# Patient Record
Sex: Female | Born: 1945
Health system: Southern US, Community
[De-identification: ages and names within clinical notes are randomized; demographics above are authoritative.]

## PROBLEM LIST (undated history)

## (undated) DIAGNOSIS — M810 Age-related osteoporosis without current pathological fracture: Secondary | ICD-10-CM

## (undated) DIAGNOSIS — R51 Headache: Secondary | ICD-10-CM

## (undated) DIAGNOSIS — R7303 Prediabetes: Secondary | ICD-10-CM

## (undated) DIAGNOSIS — I639 Cerebral infarction, unspecified: Secondary | ICD-10-CM

## (undated) DIAGNOSIS — E7800 Pure hypercholesterolemia, unspecified: Secondary | ICD-10-CM

## (undated) DIAGNOSIS — C801 Malignant (primary) neoplasm, unspecified: Secondary | ICD-10-CM

## (undated) DIAGNOSIS — E559 Vitamin D deficiency, unspecified: Secondary | ICD-10-CM

## (undated) DIAGNOSIS — M1990 Unspecified osteoarthritis, unspecified site: Secondary | ICD-10-CM

## (undated) DIAGNOSIS — K219 Gastro-esophageal reflux disease without esophagitis: Secondary | ICD-10-CM

## (undated) DIAGNOSIS — I10 Essential (primary) hypertension: Secondary | ICD-10-CM

## (undated) DIAGNOSIS — R519 Headache, unspecified: Secondary | ICD-10-CM

## (undated) DIAGNOSIS — H5500 Unspecified nystagmus: Secondary | ICD-10-CM

## (undated) DIAGNOSIS — G43909 Migraine, unspecified, not intractable, without status migrainosus: Secondary | ICD-10-CM

## (undated) HISTORY — PX: CATARACT EXTRACTION: SUR2

## (undated) HISTORY — PX: BREAST EXCISIONAL BIOPSY: SUR124

## (undated) HISTORY — PX: TUBAL LIGATION: SHX77

## (undated) HISTORY — PX: ABDOMINAL HYSTERECTOMY: SHX81

## (undated) HISTORY — PX: BREAST ENHANCEMENT SURGERY: SHX7

## (undated) HISTORY — PX: HIP SURGERY: SHX245

## (undated) HISTORY — PX: ENDOSCOPIC RELEASE TRANSVERSE CARPAL LIGAMENT OF HAND: SUR444

## (undated) HISTORY — PX: TONSILLECTOMY: SUR1361

## (undated) HISTORY — PX: EYE SURGERY: SHX253

---

## 1992-05-01 DIAGNOSIS — I639 Cerebral infarction, unspecified: Secondary | ICD-10-CM

## 1992-05-01 HISTORY — PX: OTHER SURGICAL HISTORY: SHX169

## 1992-05-01 HISTORY — DX: Cerebral infarction, unspecified: I63.9

## 2011-02-02 ENCOUNTER — Ambulatory Visit: Payer: Self-pay

## 2011-02-02 DIAGNOSIS — Z01810 Encounter for preprocedural cardiovascular examination: Secondary | ICD-10-CM

## 2012-01-03 ENCOUNTER — Ambulatory Visit: Payer: Self-pay | Admitting: Internal Medicine

## 2012-05-01 HISTORY — PX: BREAST IMPLANT REMOVAL: SUR1101

## 2013-01-21 ENCOUNTER — Ambulatory Visit: Payer: Self-pay | Admitting: Internal Medicine

## 2013-02-18 ENCOUNTER — Ambulatory Visit: Payer: Self-pay | Admitting: Ophthalmology

## 2013-03-25 ENCOUNTER — Ambulatory Visit: Payer: Self-pay | Admitting: Ophthalmology

## 2014-02-18 ENCOUNTER — Ambulatory Visit: Payer: Self-pay | Admitting: Internal Medicine

## 2014-09-04 DIAGNOSIS — R030 Elevated blood-pressure reading, without diagnosis of hypertension: Secondary | ICD-10-CM | POA: Diagnosis not present

## 2014-09-04 DIAGNOSIS — E789 Disorder of lipoprotein metabolism, unspecified: Secondary | ICD-10-CM | POA: Diagnosis not present

## 2014-09-09 DIAGNOSIS — Z23 Encounter for immunization: Secondary | ICD-10-CM | POA: Diagnosis not present

## 2014-09-09 DIAGNOSIS — K219 Gastro-esophageal reflux disease without esophagitis: Secondary | ICD-10-CM | POA: Diagnosis not present

## 2014-09-09 DIAGNOSIS — G43809 Other migraine, not intractable, without status migrainosus: Secondary | ICD-10-CM | POA: Diagnosis not present

## 2014-09-09 DIAGNOSIS — I10 Essential (primary) hypertension: Secondary | ICD-10-CM | POA: Diagnosis not present

## 2015-01-01 DIAGNOSIS — R300 Dysuria: Secondary | ICD-10-CM | POA: Diagnosis not present

## 2015-03-16 DIAGNOSIS — M7711 Lateral epicondylitis, right elbow: Secondary | ICD-10-CM | POA: Diagnosis not present

## 2015-03-16 DIAGNOSIS — G43809 Other migraine, not intractable, without status migrainosus: Secondary | ICD-10-CM | POA: Diagnosis not present

## 2015-03-16 DIAGNOSIS — I10 Essential (primary) hypertension: Secondary | ICD-10-CM | POA: Diagnosis not present

## 2015-03-16 DIAGNOSIS — M6530 Trigger finger, unspecified finger: Secondary | ICD-10-CM | POA: Diagnosis not present

## 2015-03-16 DIAGNOSIS — H159 Unspecified disorder of sclera: Secondary | ICD-10-CM | POA: Diagnosis not present

## 2015-03-16 DIAGNOSIS — Z0000 Encounter for general adult medical examination without abnormal findings: Secondary | ICD-10-CM | POA: Diagnosis not present

## 2015-03-22 DIAGNOSIS — M720 Palmar fascial fibromatosis [Dupuytren]: Secondary | ICD-10-CM | POA: Diagnosis not present

## 2015-04-08 ENCOUNTER — Other Ambulatory Visit: Payer: Self-pay

## 2015-04-08 ENCOUNTER — Encounter: Payer: Self-pay | Admitting: *Deleted

## 2015-04-08 NOTE — Patient Instructions (Signed)
  Your procedure is scheduled on: 04-16-15 (FRIDAY) Report to [facility] To find out your arrival time please call [phone] between 1PM - 3PM on 04-15-15 (THURSDAY)  Remember: Instructions that are not followed completely may result in serious medical risk, up to and including death, or upon the discretion of your surgeon and anesthesiologist your surgery may need to be rescheduled.    _X___ 1. Do not eat food or drink liquids after midnight. No gum chewing or hard candies.     _X___ 2. No Alcohol for 24 hours before or after surgery.   ____ 3. Bring all medications with you on the day of surgery if instructed.    _X___ 4. Notify your doctor if there is any change in your medical condition     (cold, fever, infections).     Do not wear jewelry, make-up, hairpins, clips or nail polish.  Do not wear lotions, powders, or perfumes. You may wear deodorant.  Do not shave 48 hours prior to surgery. Men may shave face and neck.  Do not bring valuables to the hospital.    [facility] is not responsible for any belongings or valuables.               Contacts, dentures or bridgework may not be worn into surgery.  Leave your suitcase in the car. After surgery it may be brought to your room.  For patients admitted to the hospital, discharge time is determined by your treatment team.   Patients discharged the day of surgery will not be allowed to drive home.   Please read over the following fact sheets that you were given:      ____ Take these medicines the morning of surgery with A SIP OF WATER:    1. NONE  2.   3.   4.  5.  6.  ____ Fleet Enema (as directed)   _X___ Use CHG Soap as directed  ____ Use inhalers on the day of surgery  ____ Stop metformin 2 days prior to surgery    ____ Take 1/2 of usual insulin dose the night before surgery and none on the morning of surgery.   ____ Stop Coumadin/Plavix/aspirin-STOP ASPIRIN NOW  ____ Stop  Anti-inflammatories-STOP IBUPROFEN/ALEVE NOW-NO NSAIDS OR ASPIRIN PRODUCTS-TYLENOL OK   ____ Stop supplements until after surgery.    ____ Bring C-Pap to the hospital.

## 2015-04-09 ENCOUNTER — Encounter
Admission: RE | Admit: 2015-04-09 | Discharge: 2015-04-09 | Disposition: A | Discharge status: Home / Self Care | Payer: Commercial Managed Care - HMO | Source: Ambulatory Visit | Attending: Anesthesiology | Admitting: Anesthesiology

## 2015-04-09 DIAGNOSIS — Z01810 Encounter for preprocedural cardiovascular examination: Secondary | ICD-10-CM | POA: Diagnosis not present

## 2015-04-09 DIAGNOSIS — Z01812 Encounter for preprocedural laboratory examination: Secondary | ICD-10-CM | POA: Insufficient documentation

## 2015-04-09 LAB — CBC
HEMATOCRIT: 38.5 % (ref 35.0–47.0)
HEMOGLOBIN: 12.8 g/dL (ref 12.0–16.0)
MCH: 30.9 pg (ref 26.0–34.0)
MCHC: 33.2 g/dL (ref 32.0–36.0)
MCV: 93.1 fL (ref 80.0–100.0)
PLATELETS: 349 10*3/uL (ref 150–440)
RBC: 4.14 MIL/uL (ref 3.80–5.20)
RDW: 13.7 % (ref 11.5–14.5)
WBC: 9.8 10*3/uL (ref 3.6–11.0)

## 2015-04-12 NOTE — Pre-Procedure Instructions (Signed)
CALLED DR SMITHS OFFICE REGARDING 44 MG ASA-PT WITH A H/O STROKE IN THE 1990'S-DR SMITH SAID FOR PT TO STOP HER ASA-I CALLED PT TO LET HER KNOW THIS AND SHE SAID THAT SHE HAD ALREADY STOPPED IT FOR HER SURGERY OVER THE WEEKEND

## 2015-04-16 ENCOUNTER — Ambulatory Visit
Admission: RE | Admit: 2015-04-16 | Discharge: 2015-04-16 | Disposition: A | Discharge status: Home / Self Care | Payer: Commercial Managed Care - HMO | Source: Ambulatory Visit | Attending: Specialist | Admitting: Specialist

## 2015-04-16 ENCOUNTER — Ambulatory Visit: Payer: Commercial Managed Care - HMO | Admitting: Certified Registered Nurse Anesthetist

## 2015-04-16 ENCOUNTER — Encounter
Admission: RE | Disposition: A | Discharge status: Home / Self Care | Payer: Self-pay | Source: Ambulatory Visit | Attending: Specialist

## 2015-04-16 ENCOUNTER — Encounter: Payer: Self-pay | Admitting: *Deleted

## 2015-04-16 DIAGNOSIS — H532 Diplopia: Secondary | ICD-10-CM | POA: Diagnosis not present

## 2015-04-16 DIAGNOSIS — Z7982 Long term (current) use of aspirin: Secondary | ICD-10-CM | POA: Insufficient documentation

## 2015-04-16 DIAGNOSIS — H409 Unspecified glaucoma: Secondary | ICD-10-CM | POA: Diagnosis not present

## 2015-04-16 DIAGNOSIS — Z8673 Personal history of transient ischemic attack (TIA), and cerebral infarction without residual deficits: Secondary | ICD-10-CM | POA: Diagnosis not present

## 2015-04-16 DIAGNOSIS — R51 Headache: Secondary | ICD-10-CM | POA: Insufficient documentation

## 2015-04-16 DIAGNOSIS — Z8261 Family history of arthritis: Secondary | ICD-10-CM | POA: Diagnosis not present

## 2015-04-16 DIAGNOSIS — K219 Gastro-esophageal reflux disease without esophagitis: Secondary | ICD-10-CM | POA: Insufficient documentation

## 2015-04-16 DIAGNOSIS — Z79899 Other long term (current) drug therapy: Secondary | ICD-10-CM | POA: Diagnosis not present

## 2015-04-16 DIAGNOSIS — I10 Essential (primary) hypertension: Secondary | ICD-10-CM | POA: Diagnosis not present

## 2015-04-16 DIAGNOSIS — Z8249 Family history of ischemic heart disease and other diseases of the circulatory system: Secondary | ICD-10-CM | POA: Insufficient documentation

## 2015-04-16 DIAGNOSIS — M720 Palmar fascial fibromatosis [Dupuytren]: Secondary | ICD-10-CM | POA: Diagnosis not present

## 2015-04-16 HISTORY — PX: DUPUYTREN CONTRACTURE RELEASE: SHX1478

## 2015-04-16 HISTORY — DX: Cerebral infarction, unspecified: I63.9

## 2015-04-16 HISTORY — DX: Headache: R51

## 2015-04-16 HISTORY — DX: Malignant (primary) neoplasm, unspecified: C80.1

## 2015-04-16 HISTORY — DX: Headache, unspecified: R51.9

## 2015-04-16 HISTORY — DX: Age-related osteoporosis without current pathological fracture: M81.0

## 2015-04-16 HISTORY — DX: Essential (primary) hypertension: I10

## 2015-04-16 HISTORY — DX: Unspecified osteoarthritis, unspecified site: M19.90

## 2015-04-16 HISTORY — DX: Gastro-esophageal reflux disease without esophagitis: K21.9

## 2015-04-16 SURGERY — RELEASE, DUPUYTREN CONTRACTURE
Anesthesia: General | Laterality: Left

## 2015-04-16 MED ORDER — PROPOFOL 10 MG/ML IV BOLUS
INTRAVENOUS | Status: DC | PRN
  Administered 2015-04-16: 150 mg via INTRAVENOUS

## 2015-04-16 MED ORDER — BUPIVACAINE HCL (PF) 0.5 % IJ SOLN
INTRAMUSCULAR | Status: AC
  Filled 2015-04-16: qty 30

## 2015-04-16 MED ORDER — DEXAMETHASONE SODIUM PHOSPHATE 10 MG/ML IJ SOLN
INTRAMUSCULAR | Status: DC | PRN
  Administered 2015-04-16: 5 mg via INTRAVENOUS

## 2015-04-16 MED ORDER — MIDAZOLAM HCL 2 MG/2ML IJ SOLN
INTRAMUSCULAR | Status: DC | PRN
  Administered 2015-04-16 (×2): 1 mg via INTRAVENOUS

## 2015-04-16 MED ORDER — ONDANSETRON HCL 4 MG/2ML IJ SOLN
INTRAMUSCULAR | Status: DC | PRN
  Administered 2015-04-16: 4 mg via INTRAVENOUS

## 2015-04-16 MED ORDER — FAMOTIDINE 20 MG PO TABS
ORAL_TABLET | ORAL | Status: AC
  Administered 2015-04-16: 20 mg via ORAL
  Filled 2015-04-16: qty 1

## 2015-04-16 MED ORDER — FENTANYL CITRATE (PF) 100 MCG/2ML IJ SOLN
INTRAMUSCULAR | Status: DC | PRN
  Administered 2015-04-16 (×8): 25 ug via INTRAVENOUS

## 2015-04-16 MED ORDER — EPHEDRINE SULFATE 50 MG/ML IJ SOLN
INTRAMUSCULAR | Status: DC | PRN
  Administered 2015-04-16: 5 mg via INTRAVENOUS

## 2015-04-16 MED ORDER — LABETALOL HCL 5 MG/ML IV SOLN
INTRAVENOUS | Status: DC | PRN
  Administered 2015-04-16: 10 mg via INTRAVENOUS

## 2015-04-16 MED ORDER — ONDANSETRON HCL 4 MG/2ML IJ SOLN
4.0000 mg | Freq: Once | INTRAMUSCULAR | Status: DC | PRN
Start: 2015-04-16 — End: 2015-04-16

## 2015-04-16 MED ORDER — LACTATED RINGERS IV SOLN
INTRAVENOUS | Status: DC
  Administered 2015-04-16: 07:00:00 via INTRAVENOUS

## 2015-04-16 MED ORDER — ACETAMINOPHEN 10 MG/ML IV SOLN
INTRAVENOUS | Status: DC | PRN
  Administered 2015-04-16: 1000 mg via INTRAVENOUS

## 2015-04-16 MED ORDER — HYDROCODONE-ACETAMINOPHEN 5-325 MG PO TABS: 1.0000 | ORAL_TABLET | Freq: Four times a day (QID) | ORAL | Status: DC | PRN

## 2015-04-16 MED ORDER — CEFAZOLIN SODIUM 1-5 GM-% IV SOLN
INTRAVENOUS | Status: AC
  Filled 2015-04-16: qty 50

## 2015-04-16 MED ORDER — BUPIVACAINE HCL (PF) 0.5 % IJ SOLN
INTRAMUSCULAR | Status: DC | PRN
  Administered 2015-04-16: 10 mL

## 2015-04-16 MED ORDER — FAMOTIDINE 20 MG PO TABS
20.0000 mg | ORAL_TABLET | Freq: Once | ORAL | Status: AC
  Administered 2015-04-16: 20 mg via ORAL

## 2015-04-16 MED ORDER — GLYCOPYRROLATE 0.2 MG/ML IJ SOLN
INTRAMUSCULAR | Status: DC | PRN
  Administered 2015-04-16: 0.2 mg via INTRAVENOUS

## 2015-04-16 MED ORDER — CEFAZOLIN SODIUM 1-5 GM-% IV SOLN
1.0000 g | Freq: Once | INTRAVENOUS | Status: AC
  Administered 2015-04-16: 1 g via INTRAVENOUS

## 2015-04-16 MED ORDER — FENTANYL CITRATE (PF) 100 MCG/2ML IJ SOLN: 25.0000 ug | INTRAMUSCULAR | Status: DC | PRN

## 2015-04-16 MED ORDER — LIDOCAINE HCL (CARDIAC) 20 MG/ML IV SOLN
INTRAVENOUS | Status: DC | PRN
  Administered 2015-04-16: 60 mg via INTRAVENOUS

## 2015-04-16 MED ORDER — ACETAMINOPHEN 10 MG/ML IV SOLN
INTRAVENOUS | Status: AC
Start: 2015-04-16 — End: 2015-04-16
  Filled 2015-04-16: qty 100

## 2015-04-16 SURGICAL SUPPLY — 25 items
BANDAGE ELASTIC 3 LF NS (GAUZE/BANDAGES/DRESSINGS) ×3 IMPLANT
BLADE SURG 15 STRL LF DISP TIS (BLADE) ×2 IMPLANT
BLADE SURG 15 STRL SS (BLADE) ×4
BNDG ELASTIC 2X5.8 VLCR STR LF (GAUZE/BANDAGES/DRESSINGS) ×3 IMPLANT
BNDG ESMARK 4X12 TAN STRL LF (GAUZE/BANDAGES/DRESSINGS) ×3 IMPLANT
CAST PADDING 2X4YD ST 30245 (MISCELLANEOUS) ×2
CHLORAPREP W/TINT 26ML (MISCELLANEOUS) ×3 IMPLANT
GAUZE PETRO XEROFOAM 1X8 (MISCELLANEOUS) ×3 IMPLANT
GAUZE SPONGE 4X4 12PLY STRL (GAUZE/BANDAGES/DRESSINGS) ×3 IMPLANT
GLOVE BIO SURGEON STRL SZ7.5 (GLOVE) ×6 IMPLANT
GOWN STRL REUS W/ TWL LRG LVL3 (GOWN DISPOSABLE) ×1 IMPLANT
GOWN STRL REUS W/TWL LRG LVL3 (GOWN DISPOSABLE) ×2
LOOP VESSEL MINI 0.8X406 BLUE (MISCELLANEOUS) ×1 IMPLANT
LOOPS BLUE MINI 0.8X406MM (MISCELLANEOUS) ×2
NS IRRIG 1000ML POUR BTL (IV SOLUTION) ×3 IMPLANT
PACK EXTREMITY ARMC (MISCELLANEOUS) ×3 IMPLANT
PADDING CAST COTTON 2X4 ST (MISCELLANEOUS) ×1 IMPLANT
SPLINT CAST 1 STEP 3X12 (MISCELLANEOUS) ×3 IMPLANT
STOCKINETTE BIAS CUT 3 980034 (MISCELLANEOUS) ×3 IMPLANT
STOCKINETTE STRL 4IN 9604848 (GAUZE/BANDAGES/DRESSINGS) ×3 IMPLANT
STRAP SAFETY BODY (MISCELLANEOUS) ×3 IMPLANT
SUT ETHILON 5 0 P 3 18 (SUTURE) ×26
SUT ETHILON 7 0 P 1 (SUTURE) ×3 IMPLANT
SUT NYLON ETHILON 5-0 P-3 1X18 (SUTURE) ×13 IMPLANT
SUT PROLENE 2 0 CT2 30 (SUTURE) ×3 IMPLANT

## 2015-04-16 NOTE — Anesthesia Procedure Notes (Signed)
Procedure Name: LMA Insertion Date/Time: 04/16/2015 7:26 AM Performed by: Johnna Acosta Pre-anesthesia Checklist: Patient identified, Emergency Drugs available, Suction available and Patient being monitored Patient Re-evaluated:Patient Re-evaluated prior to inductionOxygen Delivery Method: Circle system utilized Preoxygenation: Pre-oxygenation with 100% oxygen Intubation Type: IV induction Ventilation: Mask ventilation without difficulty LMA: LMA inserted LMA Size: 3.5 Tube type: Oral Number of attempts: 1 Placement Confirmation: ETT inserted through vocal cords under direct vision,  positive ETCO2 and breath sounds checked- equal and bilateral Tube secured with: Tape Dental Injury: Teeth and Oropharynx as per pre-operative assessment

## 2015-04-16 NOTE — Anesthesia Preprocedure Evaluation (Signed)
Anesthesia Evaluation  Patient identified by MRN, date of birth, ID band Patient awake    Reviewed: Allergy & Precautions, H&P , NPO status , Patient's Chart, lab work & pertinent test results, reviewed documented beta blocker date and time   Airway Mallampati: II  TM Distance: >3 FB Neck ROM: full    Dental no notable dental hx. (+) Teeth Intact   Pulmonary neg pulmonary ROS,    Pulmonary exam normal breath sounds clear to auscultation       Cardiovascular Exercise Tolerance: Good hypertension, negative cardio ROS Normal cardiovascular exam Rhythm:regular Rate:Normal     Neuro/Psych  Headaches, Periodic double vision but no upper or lower extr loss of function described CVA, Residual Symptoms negative neurological ROS  negative psych ROS   GI/Hepatic negative GI ROS, Neg liver ROS, GERD  ,  Endo/Other  negative endocrine ROS  Renal/GU negative Renal ROS  negative genitourinary   Musculoskeletal   Abdominal   Peds  Hematology negative hematology ROS (+)   Anesthesia Other Findings   Reproductive/Obstetrics negative OB ROS                             Anesthesia Physical Anesthesia Plan  ASA: III  Anesthesia Plan: General and General LMA   Post-op Pain Management:    Induction:   Airway Management Planned:   Additional Equipment:   Intra-op Plan:   Post-operative Plan:   Informed Consent: I have reviewed the patients History and Physical, chart, labs and discussed the procedure including the risks, benefits and alternatives for the proposed anesthesia with the patient or authorized representative who has indicated his/her understanding and acceptance.   Dental Advisory Given  Plan Discussed with: CRNA  Anesthesia Plan Comments: (Refuses regional or bier block.  )        Anesthesia Quick Evaluation

## 2015-04-16 NOTE — H&P (Signed)
  69 year old female with severe Dupuytren's disease left hand.  History and physical exam has been inserted into the record in the form of a paper document.  Heart and lungs are clear.  ENT normal  Plan: excision of extensive Dupuytren's disease left hand.

## 2015-04-16 NOTE — Anesthesia Postprocedure Evaluation (Signed)
Anesthesia Post Note  Patient: Natalie Cooper  Procedure(s) Performed: Procedure(s) (LRB): DUPUYTREN CONTRACTURE RELEASE index and ring finger (Left)  Patient location during evaluation: PACU Anesthesia Type: General Level of consciousness: awake and alert Pain management: pain level controlled Vital Signs Assessment: post-procedure vital signs reviewed and stable Respiratory status: spontaneous breathing, nonlabored ventilation, respiratory function stable and patient connected to nasal cannula oxygen Cardiovascular status: blood pressure returned to baseline and stable Anesthetic complications: no    Last Vitals:  Filed Vitals:   04/16/15 1209 04/16/15 1254  BP: 113/61 102/59  Pulse: 58 57  Temp: 37.1 C   Resp: 18 18    Last Pain:  Filed Vitals:   04/16/15 1255  PainSc: 0-No pain                 Molli Barrows

## 2015-04-16 NOTE — Brief Op Note (Signed)
04/16/2015  11:05 AM  PATIENT:  Karrie Doffing  69 y.o. female  PRE-OPERATIVE DIAGNOSIS:  palmar fascial  POST-OPERATIVE DIAGNOSIS:  * No post-op diagnosis entered *  PROCEDURE:  Procedure(s): DUPUYTREN CONTRACTURE RELEASE index and ring finger (Left)  SURGEON:  Surgeon(s) and Role:    * Christophe Louis, MD - Primary  PHYSICIAN ASSISTANT:   ASSISTANTS: none   ANESTHESIA:   general  EBL:  Total I/O In: 1000 [I.V.:1000] Out: 3 [Blood:3]  BLOOD ADMINISTERED:none  DRAINS: none   LOCAL MEDICATIONS USED:  MARCAINE     SPECIMEN:  No Specimen  DISPOSITION OF SPECIMEN:  N/A  COUNTS:  YES  TOURNIQUET:    DICTATION: .Other Dictation: Dictation Number [id number]  PLAN OF CARE: Discharge to home after PACU  PATIENT DISPOSITION:  PACU - hemodynamically stable.   Delay start of Pharmacological VTE agent (>24hrs) due to surgical blood loss or risk of bleeding: not applicable

## 2015-04-16 NOTE — Transfer of Care (Signed)
Immediate Anesthesia Transfer of Care Note  Patient: Natalie Cooper  Procedure(s) Performed: Procedure(s): DUPUYTREN CONTRACTURE RELEASE index and ring finger (Left)  Patient Location: PACU  Anesthesia Type:General  Level of Consciousness: sedated  Airway & Oxygen Therapy: Patient Spontanous Breathing and Patient connected to nasal cannula oxygen  Post-op Assessment: Report given to RN and Post -op Vital signs reviewed and stable  Post vital signs: Reviewed and stable  Last Vitals:  Filed Vitals:   04/16/15 0624 04/16/15 1045  BP: 148/72 161/77  Pulse: 71 69  Temp: 35.6 C 37.2 C  Resp: 16     Complications: No apparent anesthesia complications

## 2015-04-16 NOTE — Discharge Instructions (Signed)
Elevate hand at all times Keep splint/dressing clean and intact 2-3 times per day push tips of fingers into extenson, toward the back of her hand

## 2015-04-17 NOTE — Op Note (Signed)
NAME:  Natalie Cooper, Natalie Cooper NO.:  [id number]  MEDICAL RECORD NO.:  [id number]  LOCATION:  [facility]                         FACILITY:  [facility]  PHYSICIAN:  Margaretmary Eddy, MD        DATE OF BIRTH:  [birth year]  DATE OF PROCEDURE:  04/16/2015 DATE OF DISCHARGE:  04/16/2015                              OPERATIVE REPORT   PREOPERATIVE DIAGNOSIS:  Severe Dupuytren's disease, left hand with significant contractures of left index, long, and ring fingers.  PROCEDURE: 1. Excision of prominent cord, left index finger with release of index     PIP joint. 2. Release of prominent cord, left long finger with PIP joint release. 3. Release of prominent cord, left ring finger with PIP joint release.  SURGEON:  Margaretmary Eddy, MD.  ANESTHESIA:  General.  TOURNIQUET TIME:  2 hours and 15 minutes.  DRAINS:  None.  DESCRIPTION OF PROCEDURE:  Ancef 1 g was given intravenously prior to the procedure.  General anesthesia is induced.  As per the history and physical exam, this is a hand with severe Dupuytren's disease in the palm and contractures of the index, long, and ring fingers.  The extremity is wrapped out with the Esmarch bandage and the tourniquet was elevated to 225 mmHg.  The entire procedure was performed under loupe magnification.  First the index finger was palpated and there was seen to be a 90-degree flexion contracture of the PIP joint with a prominent cord extending out to the proximal phalanx.  Standard Bruner incision is made and the Dupuytren's fascia was dissected out proximally and then, the digital neurovascular bundles on each side are identified and dissected all the way out to the middle phalanx.  The Dupuytren's fascia was then excised.  The 90-degree flexion contracture of the PIP joint still existed and therefore the volar plate of the PIP joint is completely released along with the collateral ligaments and the PIP joint was able to be brought out to the neutral  position.  The wounds were thoroughly irrigated multiple times.  This incision is closed with multiple 5-0 nylon sutures.  Next, attention was then paid to the long and ring fingers.  A single prominent cord extends out to the web between the 2 fingers.  Again, standard Bruner-type incision was made and the Dupuytren's fascia was identified proximally and excised and then the digital neurovascular bundles were dissected out through the Dupuytren's fascia involving both the long finger and the ring finger. After excision of the fascia, the ring finger PIP joint easily was able to be extended to the neutral position.  At the ring finger, however, there was an extremely solid block of Dupuytren's fascia along the radial aspect of the proximal phalanx.  The radial digital nerve was completely encased in this block and Dupuytren's fascia and in the process of dissecting this out, the digital nerve was transected.  All of the fascia was excised.  The volar plate of the PIP joint is released at the ring finger.  The radial digital nerve of the ring finger was then repaired using multiple 7-0 nylon sutures.  The tourniquet was then released and capillary refill returned  immediately to all of the tips of the fingers.  The wounds were thoroughly irrigated multiple times with normal saline.  All wounds were then closed with 5-0 nylon.  Soft bulky dressing was applied with a volar fiberglass splint, keeping the fingers moderately extended.  Tourniquet was released.  The patient was sent to the recovery room in satisfactory condition having tolerated the procedure quite well.          ______________________________ Margaretmary Eddy, MD     CS/MEDQ  D:  04/17/2015  T:  04/17/2015  Job:  [id number]

## 2015-05-06 DIAGNOSIS — Z961 Presence of intraocular lens: Secondary | ICD-10-CM | POA: Diagnosis not present

## 2015-05-26 ENCOUNTER — Ambulatory Visit: Payer: Commercial Managed Care - HMO | Attending: Specialist | Admitting: Occupational Therapy

## 2015-05-26 DIAGNOSIS — M6281 Muscle weakness (generalized): Secondary | ICD-10-CM | POA: Diagnosis not present

## 2015-05-26 DIAGNOSIS — L905 Scar conditions and fibrosis of skin: Secondary | ICD-10-CM

## 2015-05-26 DIAGNOSIS — M79642 Pain in left hand: Secondary | ICD-10-CM | POA: Diagnosis not present

## 2015-05-26 DIAGNOSIS — M25642 Stiffness of left hand, not elsewhere classified: Secondary | ICD-10-CM

## 2015-05-26 NOTE — Patient Instructions (Signed)
Dorsal hand base splint with  3rd and 4th digit extention night time  LMB splint to use 2-3 x day for 2nd and 4th PIP  5 min  HEat  Then PROM for flexion DIP , PIP and composite flexion with extention  Rollng of putty for extention   Scar massage hand out provided and scar pad / digisleeves   Tendon glides AROM each step 10 reps  Opposition to all digits

## 2015-05-26 NOTE — Therapy (Signed)
[place] [facility] [address], [place], [address] Phone: [phone]   Fax:  [phone]  Occupational Therapy Treatment  Patient Details  Name: Natalie Cooper MRN: [id number] Date of Birth: Mar 03, 1946 Referring Provider: Margaretmary Eddy   Encounter Date: 05/26/2015      OT End of Session - 05/26/15 1440    Visit Number 1   Number of Visits 8   Date for OT Re-Evaluation 06/23/15   OT Start Time 1117   OT Stop Time 1213   OT Time Calculation (min) 56 min   Activity Tolerance Patient tolerated treatment well   Behavior During Therapy [facility] for tasks assessed/performed      Past Medical History  Diagnosis Date  . Hypertension   . GERD (gastroesophageal reflux disease)   . Osteoporosis   . Headache     MIGRAINES  . Arthritis   . Cancer (HCC)     BASAL CELL   . Stroke ([place]) 1994    HEMORRHAGIC STROKE-HAD GAMMA KNIFE BRAIN PROCEDURE-PT DOES HAVE DOUBLE VISION THAT HAPPENS EVERY 15 MINUTES AND RESOLVES WITHIN 3 MINUTES SINCE HER STROKE IN 1994    Past Surgical History  Procedure Laterality Date  . Abdominal hysterectomy    . Breast enhancement surgery    . Gamma knife brain procedure  1994  . Breast implant removal  2014  . Dupuytren contracture release Left 04/16/2015    Procedure: DUPUYTREN CONTRACTURE RELEASE index and ring finger;  Surgeon: Christophe Louis, MD;  Location: [facility] ORS;  Service: Orthopedics;  Laterality: Left;    There were no vitals filed for this visit.  Visit Diagnosis:  Stiffness of finger joint of left hand - Plan: Ot plan of care cert/re-cert  Pain of left hand - Plan: Ot plan of care cert/re-cert  Scar of hand - Plan: Ot plan of care cert/re-cert  Muscle weakness - Plan: Ot plan of care cert/re-cert      Subjective Assessment - 05/26/15 1423    Subjective  I had surgery about 12/16 -  did okay but still a lot of scar. tender and thick  , ring finger not straight anymore and my  old splints not fitting - and cannot use my hand - grip anything - I sleep with my hand under my head or pillow    Patient Stated Goals I would like to be able to get my middle and ring finger straigth , better splnt to use , grip objects like brush, fork, knit again , turn doorknob, open jars , feed my cats ; ect    Currently in Pain? Yes   Pain Score 2    Pain Location Hand   Pain Orientation Left   Pain Descriptors / Indicators Aching;Burning;Stabbing   Pain Type Surgical pain   Pain Onset 1 to 4 weeks ago            [facility] OT Assessment - 05/26/15 0001    Assessment   Diagnosis L dupuytrens release   Referring Provider Margaretmary Eddy    Onset Date 04/16/15   Assessment Pt refer by DR Tamala Julian for  OT after surgery - pt has thick scar tissue , risk for flexion contracture again , pain and decrease functional use    Home  Environment   Lives With Spouse   Prior Function   Level of Independence Independent   Vocation Retired   Leisure Pt do own house work , Training and development officer , Radio producer, crafts,  has  5 inside cats, read on tablet , on computer some ,  R hand dominant    Left Hand AROM   L Index  MCP 0-90 90 Degrees  -35   L Index PIP 0-100 85 Degrees  -55   L Index DIP 0-70 5 Degrees   L Long  MCP 0-90 90 Degrees  -30   L Long PIP 0-100 95 Degrees  -15   L Long DIP 0-70 35 Degrees   L Ring  MCP 0-90 75 Degrees   L Ring PIP 0-100 95 Degrees  -45   L Ring DIP 0-70 25 Degrees   L Little  MCP 0-90 90 Degrees   L Little PIP 0-100 95 Degrees               Hand base dorsal hand splint for night time use - include 3rd and 4th to increase extention  LMB splint for use on 2nd and 4th PIP 5 min - 2 -3 x day            OT Education - 05/26/15 1440    Education provided Yes   Education Details see pt instruction    Person(s) Educated Patient   Methods Explanation;Demonstration;Tactile cues;Verbal cues;Handout   Comprehension Verbal cues required;Returned demonstration;Verbalized  understanding          OT Short Term Goals - 05/26/15 1444    OT SHORT TERM GOAL #1   Title Pt to be in wearing of splint to increaes digits extention and decrease flexoin contracture    Baseline has 2 old splints that do not fit    Time 1   Period Weeks   Status New   OT SHORT TERM GOAL #2   Title Pt's extention in 3rd and 4th digits increase by 5-10 degrees to reach in pocket and cabinet    Baseline see flowsheet    Time 3   Period Weeks   Status New   OT SHORT TERM GOAL #3   Title Flexon of digits increase for pt to make full fist to palm to hold sleeve    Baseline see flowsheet   Time 3   Period Weeks   Status New           OT Long Term Goals - 05/26/15 1446    OT LONG TERM GOAL #1   Title scar tissue improve for pt to tolerate scar massage , pressure to hold tools and kitchen tools in hand for gripping    Baseline Tender with massage and adhesions, hyper trophic mulitple areas    Time 4   Period Weeks   Status New   OT LONG TERM GOAL #2   Title Grip improve in L hand to 50% compare to R    Baseline Not assess yet    Time 4   Period Weeks   Status New               Plan - 05/26/15 1441    Clinical Impression Statement Pt present about 5 wks postop from dupuytrens release on L hand for 2nd , 3rd and 4th  - pt has thick scars in palm and on palmar surface of 2nd thru 4th digits - decrease extentio  and flexion - decrease grip and fucntional use of L hand in ADL' adn IADL's - pt in need for  correc  splint , OT to decrease risk for flexor contracture, ROM and increaes use of L hand    Pt will benefit  from skilled therapeutic intervention in order to improve on the following deficits (Retired) Decreased range of motion;Decreased coordination;Impaired flexibility;Decreased skin integrity;Pain;Impaired UE functional use;Decreased scar mobility;Decreased knowledge of use of DME;Decreased strength   Rehab Potential Good   OT Frequency 2x / week   OT Duration 4  weeks   OT Treatment/Interventions Self-care/ADL training;Parrafin;Moist Heat;Ultrasound;Fluidtherapy;DME and/or AE instruction;Splinting;Patient/family education;Therapeutic exercises;Scar mobilization;Passive range of motion;Manual Therapy   Plan Assess splint fit  HEP    OT Home Exercise Plan see pt instruction    Consulted and Agree with Plan of Care Patient        Problem List There are no active problems to display for this patient.   Rosalyn Gess OTR/l,CLT 05/26/2015, 2:52 PM  [place] [facility] [address], [place], [address] Phone: [phone]   Fax:  [phone]  Name: DENNIELLE LEVENBERG MRN: [id number] Date of Birth: Sep 24, 1945

## 2015-06-01 ENCOUNTER — Ambulatory Visit: Payer: Commercial Managed Care - HMO | Admitting: Occupational Therapy

## 2015-06-01 DIAGNOSIS — M6281 Muscle weakness (generalized): Secondary | ICD-10-CM | POA: Diagnosis not present

## 2015-06-01 DIAGNOSIS — M25642 Stiffness of left hand, not elsewhere classified: Secondary | ICD-10-CM

## 2015-06-01 DIAGNOSIS — L905 Scar conditions and fibrosis of skin: Secondary | ICD-10-CM | POA: Diagnosis not present

## 2015-06-01 DIAGNOSIS — M79642 Pain in left hand: Secondary | ICD-10-CM

## 2015-06-01 NOTE — Therapy (Signed)
[place] [facility] [address], [place], [address] Phone: [phone]   Fax:  [phone]  Occupational Therapy Treatment  Patient Details  Name: Natalie Cooper MRN: [id number] Date of Birth: Jan 03, 1946 Referring Provider: Margaretmary Eddy   Encounter Date: 06/01/2015      OT End of Session - 06/01/15 1750    Visit Number 2   Number of Visits 8   Date for OT Re-Evaluation 06/23/15   OT Start Time 0935   OT Stop Time 1016   OT Time Calculation (min) 41 min   Activity Tolerance Patient tolerated treatment well   Behavior During Therapy [facility] for tasks assessed/performed      Past Medical History  Diagnosis Date  . Hypertension   . GERD (gastroesophageal reflux disease)   . Osteoporosis   . Headache     MIGRAINES  . Arthritis   . Cancer (HCC)     BASAL CELL   . Stroke ([place]) 1994    HEMORRHAGIC STROKE-HAD GAMMA KNIFE BRAIN PROCEDURE-PT DOES HAVE DOUBLE VISION THAT HAPPENS EVERY 15 MINUTES AND RESOLVES WITHIN 3 MINUTES SINCE HER STROKE IN 1994    Past Surgical History  Procedure Laterality Date  . Abdominal hysterectomy    . Breast enhancement surgery    . Gamma knife brain procedure  1994  . Breast implant removal  2014  . Dupuytren contracture release Left 04/16/2015    Procedure: DUPUYTREN CONTRACTURE RELEASE index and ring finger;  Surgeon: Christophe Louis, MD;  Location: [facility] ORS;  Service: Orthopedics;  Laterality: Left;    There were no vitals filed for this visit.  Visit Diagnosis:  Pain of left hand  Stiffness of finger joint of left hand  Scar of hand  Muscle weakness      Subjective Assessment - 06/01/15 1740    Subjective  My hand is doing okay - in the morning it is so traight - night time splint doing okay - no problems - but the spring splint huring some what  - but I am starting use my hand more    Patient Stated Goals I would like to be able to get my middle and ring finger straigth  , better splnt to use , grip objects like brush, fork, knit again , turn doorknob, open jars , feed my cats ; ect    Currently in Pain? No/denies                      OT Treatments/Exercises (OP) - 06/01/15 0001    Hand Exercises   Other Hand Exercises tendon glides after composite flexoin of digits - able to touch palm    Other Hand Exercises PROM over putty for PIP extnetio n, rolling putty - green - as well as ABD and ADD of digts - pushing R inbetween L -   LUE Paraffin   Number Minutes Paraffin 10 Minutes   LUE Paraffin Location Hand   Comments LMB PIP extention splints on 3rd and 4th during parafin to increase PIP extention - wtih heatingpad at [facility]    Splinting   Splinting ed on correctly using LMB splint  for PIP extentio [facility]    Manual Therapy   Manual therapy comments Scar massage done over palmar and digit scar - ed again on use of scar pad - and where to focus on - 4th at PIP and in palm , 3rd at [facility] and palm - MC spreads  done and joint mobs of [facility]' pt 's husband to do at home                 OT Education - 06/01/15 1750    Education provided Yes   Education Details HEP   Person(s) Educated Patient   Methods Explanation;Demonstration;Tactile cues;Verbal cues;Handout   Comprehension Verbal cues required;Returned demonstration;Verbalized understanding          OT Short Term Goals - 05/26/15 1444    OT SHORT TERM GOAL #1   Title Pt to be in wearing of splint to increaes digits extention and decrease flexoin contracture    Baseline has 2 old splints that do not fit    Time 1   Period Weeks   Status New   OT SHORT TERM GOAL #2   Title Pt's extention in 3rd and 4th digits increase by 5-10 degrees to reach in pocket and cabinet    Baseline see flowsheet    Time 3   Period Weeks   Status New   OT SHORT TERM GOAL #3   Title Flexon of digits increase for pt to make full fist to palm to hold sleeve    Baseline see flowsheet   Time 3   Period  Weeks   Status New           OT Long Term Goals - 05/26/15 1446    OT LONG TERM GOAL #1   Title scar tissue improve for pt to tolerate scar massage , pressure to hold tools and kitchen tools in hand for gripping    Baseline Tender with massage and adhesions, hyper trophic mulitple areas    Time 4   Period Weeks   Status New   OT LONG TERM GOAL #2   Title Grip improve in L hand to 50% compare to R    Baseline Not assess yet    Time 4   Period Weeks   Status New               Plan - 06/01/15 1752    Clinical Impression Statement Pt show increase 3rd digit extention , still decrease at 4th PIP - but improving - scar still adhere and thick - scar pads provided - and sleeves - pt to cont to focus of extention of digits more than flexoin - no squeezing of putty  just rolling and change extention exercise to be done  last -    Pt will benefit from skilled therapeutic intervention in order to improve on the following deficits (Retired) Decreased range of motion;Decreased coordination;Impaired flexibility;Decreased skin integrity;Pain;Impaired UE functional use;Decreased scar mobility;Decreased knowledge of use of DME;Decreased strength   Rehab Potential Good   OT Frequency 2x / week   OT Duration 4 weeks   OT Treatment/Interventions Self-care/ADL training;Parrafin;Moist Heat;Ultrasound;Fluidtherapy;DME and/or AE instruction;Splinting;Patient/family education;Therapeutic exercises;Scar mobilization;Passive range of motion;Manual Therapy   Plan assess ROM , ultrasound    OT Home Exercise Plan see pt instruction    Consulted and Agree with Plan of Care Patient        Problem List There are no active problems to display for this patient.   Rosalyn Gess OTR/L,CLT 06/01/2015, 5:56 PM  [place] [facility] [address] [place], [address] Phone: [phone]   Fax:  [phone]  Name: Natalie Cooper MRN:  [id number] Date of Birth: 1945/06/25

## 2015-06-01 NOTE — Patient Instructions (Signed)
Flexion HEP first Massage of scar m [facility] 's joint mobs by husband and MC spread   Heat with LMB splint on for PIP extention PROM of digits on putty  Rolling of putty  Splint wearing

## 2015-06-03 ENCOUNTER — Ambulatory Visit: Payer: Commercial Managed Care - HMO | Attending: Specialist | Admitting: Occupational Therapy

## 2015-06-03 DIAGNOSIS — M6281 Muscle weakness (generalized): Secondary | ICD-10-CM | POA: Diagnosis not present

## 2015-06-03 DIAGNOSIS — M25642 Stiffness of left hand, not elsewhere classified: Secondary | ICD-10-CM

## 2015-06-03 DIAGNOSIS — M79642 Pain in left hand: Secondary | ICD-10-CM | POA: Diagnosis not present

## 2015-06-03 DIAGNOSIS — L905 Scar conditions and fibrosis of skin: Secondary | ICD-10-CM | POA: Insufficient documentation

## 2015-06-03 NOTE — Therapy (Signed)
[place] [facility] [address], [place], [address] Phone: [phone]   Fax:  [phone]  Occupational Therapy Treatment  Patient Details  Name: Natalie Cooper MRN: [id number] Date of Birth: 09-15-1945 Referring Provider: Margaretmary Eddy   Encounter Date: 06/03/2015      OT End of Session - 06/03/15 1647    Visit Number 3   Number of Visits 8   Date for OT Re-Evaluation 06/23/15   OT Start Time 0925   OT Stop Time 1015   OT Time Calculation (min) 50 min   Activity Tolerance Patient tolerated treatment well   Behavior During Therapy [facility] for tasks assessed/performed      Past Medical History  Diagnosis Date  . Hypertension   . GERD (gastroesophageal reflux disease)   . Osteoporosis   . Headache     MIGRAINES  . Arthritis   . Cancer (HCC)     BASAL CELL   . Stroke ([place]) 1994    HEMORRHAGIC STROKE-HAD GAMMA KNIFE BRAIN PROCEDURE-PT DOES HAVE DOUBLE VISION THAT HAPPENS EVERY 15 MINUTES AND RESOLVES WITHIN 3 MINUTES SINCE HER STROKE IN 1994    Past Surgical History  Procedure Laterality Date  . Abdominal hysterectomy    . Breast enhancement surgery    . Gamma knife brain procedure  1994  . Breast implant removal  2014  . Dupuytren contracture release Left 04/16/2015    Procedure: DUPUYTREN CONTRACTURE RELEASE index and ring finger;  Surgeon: Christophe Louis, MD;  Location: [facility] ORS;  Service: Orthopedics;  Laterality: Left;    There were no vitals filed for this visit.  Visit Diagnosis:  Pain of left hand  Stiffness of finger joint of left hand  Scar of hand  Muscle weakness      Subjective Assessment - 06/03/15 0936    Subjective  Still numbness in the am - and then stabbing pain hear and there - but less - and then using it more - can turn doorknob , cut last night some food,- still hard to open pill holder   Patient Stated Goals I would like to be able to get my middle and ring finger straigth ,  better splnt to use , grip objects like brush, fork, knit again , turn doorknob, open jars , feed my cats ; ect    Currently in Pain? No/denies            [facility] OT Assessment - 06/03/15 0001    Strength   Right Hand Grip (lbs) 31   Right Hand Lateral Pinch 13 lbs   Right Hand 3 Point Pinch 10 lbs   Left Hand Grip (lbs) 14   Left Hand Lateral Pinch 11 lbs   Left Hand 3 Point Pinch 7 lbs   Left Hand AROM   L Ring PIP 0-100 -40 Degrees  -30 end of session    L Ring DIP 0-70 50 Degrees                  OT Treatments/Exercises (OP) - 06/03/15 0001    Hand Exercises   Other Hand Exercises Able to make fist touching palm after 30 min of extnetion - grip and prehension strength assess this date - see flowsheet    Other Hand Exercises ABD of digits improve for pt to be able to slide all the way down - rolling of putty for extention , pushing behind PIP's     Ultrasound   Ultrasound  Location palmar scar    Ultrasound Parameters CUS at 3.3MHZ at 0.8 and  over palmar scar prior to manual therpy   Ultrasound Goals Other (Comment)   LUE Paraffin   Number Minutes Paraffin 10 Minutes   LUE Paraffin Location Hand   Comments LMB splints on 3rd and 4th to increase PIP extention - at [facility] to increase ROM    Manual Therapy   Manual therapy comments Scar massage done over palmar and digit scar - ed again on use of scar pad ( pt used it wrong ) -  and where to focus on - 4th at PIP and in palm , 3rd at [facility] and palm - [place] spreads done and joint mobs of [facility]' pt 's husband to do at home - did PIP traction to 4th                 OT Education - 06/03/15 1647    Education provided Yes   Education Details HEP - progress   Person(s) Educated Patient   Methods Explanation;Demonstration;Tactile cues;Verbal cues   Comprehension Verbalized understanding;Returned demonstration;Verbal cues required          OT Short Term Goals - 05/26/15 1444    OT SHORT TERM GOAL #1   Title Pt to be in  wearing of splint to increaes digits extention and decrease flexoin contracture    Baseline has 2 old splints that do not fit    Time 1   Period Weeks   Status New   OT SHORT TERM GOAL #2   Title Pt's extention in 3rd and 4th digits increase by 5-10 degrees to reach in pocket and cabinet    Baseline see flowsheet    Time 3   Period Weeks   Status New   OT SHORT TERM GOAL #3   Title Flexon of digits increase for pt to make full fist to palm to hold sleeve    Baseline see flowsheet   Time 3   Period Weeks   Status New           OT Long Term Goals - 05/26/15 1446    OT LONG TERM GOAL #1   Title scar tissue improve for pt to tolerate scar massage , pressure to hold tools and kitchen tools in hand for gripping    Baseline Tender with massage and adhesions, hyper trophic mulitple areas    Time 4   Period Weeks   Status New   OT LONG TERM GOAL #2   Title Grip improve in L hand to 50% compare to R    Baseline Not assess yet    Time 4   Period Weeks   Status New               Plan - 06/03/15 1648    Clinical Impression Statement Pt cont to make progress in ROM in extention of digits - compare to measurements she did prior to surgery - she increase extention by 2 to 2.5 inches - in 2nd ,3rd and 4th - pt also able to touch palm with fisting aftter 30 min of extnention work - assess grip strengt this date - will intiate putty for grip next session    Pt will benefit from skilled therapeutic intervention in order to improve on the following deficits (Retired) Decreased range of motion;Decreased coordination;Impaired flexibility;Decreased skin integrity;Pain;Impaired UE functional use;Decreased scar mobility;Decreased knowledge of use of DME;Decreased strength   Rehab Potential Good   OT Frequency 2x /  week   OT Duration 4 weeks   OT Treatment/Interventions Self-care/ADL training;Parrafin;Moist Heat;Ultrasound;Fluidtherapy;DME and/or AE instruction;Splinting;Patient/family  education;Therapeutic exercises;Scar mobilization;Passive range of motion;Manual Therapy   Plan assess ROM , initiate strenghening with putty    OT Home Exercise Plan see pt instruction    Consulted and Agree with Plan of Care Patient        Problem List There are no active problems to display for this patient.   Rosalyn Gess OTR/L,CLT 06/03/2015, 4:52 PM  [place] [facility] [address] [place], [address] Phone: [phone]   Fax:  [phone]  Name: MARCELLE DURAND MRN: [id number] Date of Birth: 1945-12-22

## 2015-06-03 NOTE — Patient Instructions (Signed)
Same as last time  

## 2015-06-07 ENCOUNTER — Ambulatory Visit: Payer: Commercial Managed Care - HMO | Admitting: Occupational Therapy

## 2015-06-07 DIAGNOSIS — M25642 Stiffness of left hand, not elsewhere classified: Secondary | ICD-10-CM

## 2015-06-07 DIAGNOSIS — M6281 Muscle weakness (generalized): Secondary | ICD-10-CM

## 2015-06-07 DIAGNOSIS — L905 Scar conditions and fibrosis of skin: Secondary | ICD-10-CM | POA: Diagnosis not present

## 2015-06-07 DIAGNOSIS — M79642 Pain in left hand: Secondary | ICD-10-CM

## 2015-06-07 NOTE — Patient Instructions (Signed)
Same HEP - add  Add grip with teal putty - 10-12 reps ROll with green and do 2 point pinch alternate digits - 3-4 x thru  2 x day

## 2015-06-07 NOTE — Therapy (Signed)
[place] [facility] [address] [place], [address] Phone: [phone]   Fax:  [phone]  Occupational Therapy Treatment  Patient Details  Name: Natalie Cooper MRN: [id number] Date of Birth: February 01, 1946 Referring Provider: Margaretmary Eddy   Encounter Date: 06/07/2015      OT End of Session - 06/07/15 1347    Visit Number 4   Number of Visits 8   Date for OT Re-Evaluation 06/23/15   OT Start Time 1146   OT Stop Time 1231   OT Time Calculation (min) 45 min   Activity Tolerance Patient tolerated treatment well   Behavior During Therapy [facility] for tasks assessed/performed      Past Medical History  Diagnosis Date  . Hypertension   . GERD (gastroesophageal reflux disease)   . Osteoporosis   . Headache     MIGRAINES  . Arthritis   . Cancer (HCC)     BASAL CELL   . Stroke ([place]) 1994    HEMORRHAGIC STROKE-HAD GAMMA KNIFE BRAIN PROCEDURE-PT DOES HAVE DOUBLE VISION THAT HAPPENS EVERY 15 MINUTES AND RESOLVES WITHIN 3 MINUTES SINCE HER STROKE IN 1994    Past Surgical History  Procedure Laterality Date  . Abdominal hysterectomy    . Breast enhancement surgery    . Gamma knife brain procedure  1994  . Breast implant removal  2014  . Dupuytren contracture release Left 04/16/2015    Procedure: DUPUYTREN CONTRACTURE RELEASE index and ring finger;  Surgeon: Christophe Louis, MD;  Location: [facility] ORS;  Service: Orthopedics;  Laterality: Left;    There were no vitals filed for this visit.  Visit Diagnosis:  Pain of left hand  Stiffness of finger joint of left hand  Scar of hand  Muscle weakness      Subjective Assessment - 06/07/15 1339    Subjective  Did okay with the exercises -  I cannot tell that my fingers got much better for straightening them - in morning more straight but as day goes - my scar somewhat sore this am - I worked it    Patient Stated Goals I would like to be able to get my middle and ring finger  straigth , better splnt to use , grip objects like brush, fork, knit again , turn doorknob, open jars , feed my cats ; ect    Currently in Pain? No/denies                      OT Treatments/Exercises (OP) - 06/07/15 0001    Hand Exercises   Other Hand Exercises PROM of PIP extnetion of 4th , prolonged stretch ,Rolling of putty -    Other Hand Exercises After extention able to touch palm for flexion after 5-8 reps - add teal putty for gripping , and then using green for rolling and do 2 point pinch alternate digits    Ultrasound   Ultrasound Location palmar scars    Ultrasound Parameters CUS at 3.3MHZ, 0.8 intensity - for 5 in prior to manual    Ultrasound Goals Other (Comment)   LUE Paraffin   Number Minutes Paraffin 10 Minutes   LUE Paraffin Location Hand   Comments PIP's extention stretch during heatinpad over hand    Manual Therapy   Manual therapy comments Scar massage done to palmar adn digital scars - MC's spreads and ABD /webspace stretches after [place] and parafin - traction of 4th PIP with jointmobs prior to extention ROM  of PIP 4th                 OT Education - 06/07/15 1347    Education provided Yes   Education Details HEP and update    Person(s) Educated Patient   Methods Explanation;Demonstration;Tactile cues;Verbal cues   Comprehension Verbal cues required;Returned demonstration;Verbalized understanding          OT Short Term Goals - 05/26/15 1444    OT SHORT TERM GOAL #1   Title Pt to be in wearing of splint to increaes digits extention and decrease flexoin contracture    Baseline has 2 old splints that do not fit    Time 1   Period Weeks   Status New   OT SHORT TERM GOAL #2   Title Pt's extention in 3rd and 4th digits increase by 5-10 degrees to reach in pocket and cabinet    Baseline see flowsheet    Time 3   Period Weeks   Status New   OT SHORT TERM GOAL #3   Title Flexon of digits increase for pt to make full fist to palm to hold  sleeve    Baseline see flowsheet   Time 3   Period Weeks   Status New           OT Long Term Goals - 05/26/15 1446    OT LONG TERM GOAL #1   Title scar tissue improve for pt to tolerate scar massage , pressure to hold tools and kitchen tools in hand for gripping    Baseline Tender with massage and adhesions, hyper trophic mulitple areas    Time 4   Period Weeks   Status New   OT LONG TERM GOAL #2   Title Grip improve in L hand to 50% compare to R    Baseline Not assess yet    Time 4   Period Weeks   Status New               Plan - 06/07/15 1347    Clinical Impression Statement Pt about 7 wks postop - did not see extention of 4th PIP since last week - can get in session -30 but when arrive -40 . Did intiate this date putty for grip and prehension strength - will cont to assess that she do not loose extnetion - grip and prehension was impaired on L when assess last week    Pt will benefit from skilled therapeutic intervention in order to improve on the following deficits (Retired) Decreased range of motion;Decreased coordination;Impaired flexibility;Decreased skin integrity;Pain;Impaired UE functional use;Decreased scar mobility;Decreased knowledge of use of DME;Decreased strength   Rehab Potential Good   OT Frequency 2x / week   OT Duration 2 weeks   OT Treatment/Interventions Self-care/ADL training;Parrafin;Moist Heat;Ultrasound;Fluidtherapy;DME and/or AE instruction;Splinting;Patient/family education;Therapeutic exercises;Scar mobilization;Passive range of motion;Manual Therapy   Plan assess PIP extention on 4th , how HEP did    OT Home Exercise Plan see pt instruction    Consulted and Agree with Plan of Care Patient        Problem List There are no active problems to display for this patient.   Rosalyn Gess OTR/L,CLT 06/07/2015, 1:50 PM  [place] [facility] [address] [place],  [address] Phone: [phone]   Fax:  [phone]  Name: Natalie Cooper MRN: [id number] Date of Birth: 02-15-1946

## 2015-06-10 ENCOUNTER — Ambulatory Visit: Payer: Commercial Managed Care - HMO | Admitting: Occupational Therapy

## 2015-06-10 DIAGNOSIS — M25642 Stiffness of left hand, not elsewhere classified: Secondary | ICD-10-CM

## 2015-06-10 DIAGNOSIS — M79642 Pain in left hand: Secondary | ICD-10-CM | POA: Diagnosis not present

## 2015-06-10 DIAGNOSIS — L905 Scar conditions and fibrosis of skin: Secondary | ICD-10-CM

## 2015-06-10 DIAGNOSIS — M6281 Muscle weakness (generalized): Secondary | ICD-10-CM | POA: Diagnosis not present

## 2015-06-10 NOTE — Patient Instructions (Signed)
Same but increase to green for gripping and prehension - but only hold 2 sec grip   Teal putty for PIP extention and rubber band for [facility] extention

## 2015-06-10 NOTE — Therapy (Signed)
[place] [facility] [address] [place], [address] Phone: [phone]   Fax:  [phone]  Occupational Therapy Treatment  Patient Details  Name: Natalie Cooper MRN: [id number] Date of Birth: 01-27-1946 Referring Provider: Margaretmary Eddy   Encounter Date: 06/10/2015      OT End of Session - 06/10/15 1313    Visit Number 5   Number of Visits 8   Date for OT Re-Evaluation 06/23/15   OT Start Time 1145   OT Stop Time 1229   OT Time Calculation (min) 44 min   Activity Tolerance Patient tolerated treatment well   Behavior During Therapy [facility] for tasks assessed/performed      Past Medical History  Diagnosis Date  . Hypertension   . GERD (gastroesophageal reflux disease)   . Osteoporosis   . Headache     MIGRAINES  . Arthritis   . Cancer (HCC)     BASAL CELL   . Stroke ([place]) 1994    HEMORRHAGIC STROKE-HAD GAMMA KNIFE BRAIN PROCEDURE-PT DOES HAVE DOUBLE VISION THAT HAPPENS EVERY 15 MINUTES AND RESOLVES WITHIN 3 MINUTES SINCE HER STROKE IN 1994    Past Surgical History  Procedure Laterality Date  . Abdominal hysterectomy    . Breast enhancement surgery    . Gamma knife brain procedure  1994  . Breast implant removal  2014  . Dupuytren contracture release Left 04/16/2015    Procedure: DUPUYTREN CONTRACTURE RELEASE index and ring finger;  Surgeon: Christophe Louis, MD;  Location: [facility] ORS;  Service: Orthopedics;  Laterality: Left;    There were no vitals filed for this visit.  Visit Diagnosis:  Pain of left hand  Stiffness of finger joint of left hand  Scar of hand  Muscle weakness      Subjective Assessment - 06/10/15 1159    Subjective  Can tell difference since using putty - but ring and pinkie fingers still crossing over - can tie my shoes , use utencils do not bother my palm over scar - still stiff in the am but stabbing/shooting pain better    Patient Stated Goals I would like to be able to get my  middle and ring finger straigth , better splnt to use , grip objects like brush, fork, knit again , turn doorknob, open jars , feed my cats ; ect    Currently in Pain? Yes   Pain Score 1    Pain Location Hand   Pain Orientation Left   Pain Descriptors / Indicators Sore   Pain Onset More than a month ago            [facility] OT Assessment - 06/10/15 0001    Strength   Right Hand Grip (lbs) 40   Right Hand Lateral Pinch 13 lbs   Right Hand 3 Point Pinch 10 lbs   Left Hand Grip (lbs) 15   Left Hand Lateral Pinch 11 lbs   Left Hand 3 Point Pinch 11 lbs                  OT Treatments/Exercises (OP) - 06/10/15 0001    Hand Exercises   Other Hand Exercises PROM of PIP extnetion of 4th , prolonged stretch ,Rolling of putty - PIP extention into putty teal - rubber band for [facility] extention all digits    Other Hand Exercises Place and hold flexion , composite fist - putty upgrade to green for gripping and prehension - but only hold 2  sec grip - decrease clicking of 3rd    Ultrasound   Ultrasound Location palmar scar    Ultrasound Parameters CUS at 3.3MHZ at 0.9 intensity over palmar scar prior to manual therapy    Ultrasound Goals Other (Comment)   LUE Paraffin   Number Minutes Paraffin 10 Minutes   LUE Paraffin Location Hand   Comments LMB splint on the 4th digits PIP -    Splinting   Splinting Fitted with buddystrap 4th and 5th - but strap under 5th to decrease deviation of 5th under 4th during fisting    Manual Therapy   Manual therapy comments Scar massage done to palmar adn digital scars - MC's spreads and ABD /webspace stretches after [place] and parafin - traction of 4th PIP with jointmobs prior to extention ROM of PIP 4th                 OT Education - 06/10/15 1312    Education provided Yes   Education Details HEP and buddy strap wearing    Person(s) Educated Patient   Methods Explanation;Demonstration;Tactile cues;Verbal cues   Comprehension Returned  demonstration;Verbalized understanding;Verbal cues required          OT Short Term Goals - 05/26/15 1444    OT SHORT TERM GOAL #1   Title Pt to be in wearing of splint to increaes digits extention and decrease flexoin contracture    Baseline has 2 old splints that do not fit    Time 1   Period Weeks   Status New   OT SHORT TERM GOAL #2   Title Pt's extention in 3rd and 4th digits increase by 5-10 degrees to reach in pocket and cabinet    Baseline see flowsheet    Time 3   Period Weeks   Status New   OT SHORT TERM GOAL #3   Title Flexon of digits increase for pt to make full fist to palm to hold sleeve    Baseline see flowsheet   Time 3   Period Weeks   Status New           OT Long Term Goals - 05/26/15 1446    OT LONG TERM GOAL #1   Title scar tissue improve for pt to tolerate scar massage , pressure to hold tools and kitchen tools in hand for gripping    Baseline Tender with massage and adhesions, hyper trophic mulitple areas    Time 4   Period Weeks   Status New   OT LONG TERM GOAL #2   Title Grip improve in L hand to 50% compare to R    Baseline Not assess yet    Time 4   Period Weeks   Status New               Plan - 06/10/15 1313    Clinical Impression Statement Pt improving in gripping , and pain /tenderness of scar /palm - increase use - still some deviation of 5th under 4th - fitted with buddy strap - and upgrade putty - prehension strength improve greatly    Pt will benefit from skilled therapeutic intervention in order to improve on the following deficits (Retired) Decreased range of motion;Decreased coordination;Impaired flexibility;Decreased skin integrity;Pain;Impaired UE functional use;Decreased scar mobility;Decreased knowledge of use of DME;Decreased strength   OT Frequency 2x / week   OT Duration 2 weeks   OT Treatment/Interventions Self-care/ADL training;Parrafin;Moist Heat;Ultrasound;Fluidtherapy;DME and/or AE  instruction;Splinting;Patient/family education;Therapeutic exercises;Scar mobilization;Passive range of motion;Manual Therapy   Plan  assess PIP , ROM progress and grip strength    OT Home Exercise Plan see pt instruction    Consulted and Agree with Plan of Care Patient          G-Codes - July 07, 2015 1314    Functional Assessment Tool Used ROM , grip , pain ,clinical judgmente    Functional Limitation Self care   Self Care Current Status [id number]) At least 1 percent but less than 20 percent impaired, limited or restricted   Self Care Goal Status [id number]) 0 percent impaired, limited or restricted      Problem List There are no active problems to display for this patient.   Rosalyn Gess OTR/l,CLT 07-07-2015, 1:16 PM  [place] [facility] [address], [place], [address] Phone: [phone]   Fax:  [phone]  Name: ANGELLI SPOTT MRN: [id number] Date of Birth: 1945-10-15

## 2015-06-15 ENCOUNTER — Ambulatory Visit: Payer: Commercial Managed Care - HMO | Admitting: Occupational Therapy

## 2015-06-15 DIAGNOSIS — M6281 Muscle weakness (generalized): Secondary | ICD-10-CM | POA: Diagnosis not present

## 2015-06-15 DIAGNOSIS — M79642 Pain in left hand: Secondary | ICD-10-CM

## 2015-06-15 DIAGNOSIS — M25642 Stiffness of left hand, not elsewhere classified: Secondary | ICD-10-CM

## 2015-06-15 DIAGNOSIS — L905 Scar conditions and fibrosis of skin: Secondary | ICD-10-CM | POA: Diagnosis not present

## 2015-06-15 NOTE — Therapy (Signed)
[place] [facility] [address], [place], [address] Phone: [phone]   Fax:  [phone]  Occupational Therapy Treatment  Patient Details  Name: Natalie Cooper MRN: [id number] Date of Birth: 23-Aug-1945 Referring Provider: Margaretmary Eddy   Encounter Date: 06/15/2015      OT End of Session - 06/15/15 1457    Visit Number 6   Number of Visits 8   Date for OT Re-Evaluation 06/23/15   OT Start Time 1120   OT Stop Time 1211   OT Time Calculation (min) 51 min   Activity Tolerance Patient tolerated treatment well   Behavior During Therapy [facility] for tasks assessed/performed      Past Medical History  Diagnosis Date  . Hypertension   . GERD (gastroesophageal reflux disease)   . Osteoporosis   . Headache     MIGRAINES  . Arthritis   . Cancer (HCC)     BASAL CELL   . Stroke ([place]) 1994    HEMORRHAGIC STROKE-HAD GAMMA KNIFE BRAIN PROCEDURE-PT DOES HAVE DOUBLE VISION THAT HAPPENS EVERY 15 MINUTES AND RESOLVES WITHIN 3 MINUTES SINCE HER STROKE IN 1994    Past Surgical History  Procedure Laterality Date  . Abdominal hysterectomy    . Breast enhancement surgery    . Gamma knife brain procedure  1994  . Breast implant removal  2014  . Dupuytren contracture release Left 04/16/2015    Procedure: DUPUYTREN CONTRACTURE RELEASE index and ring finger;  Surgeon: Christophe Louis, MD;  Location: [facility] ORS;  Service: Orthopedics;  Laterality: Left;    There were no vitals filed for this visit.  Visit Diagnosis:  Pain of left hand  Stiffness of finger joint of left hand  Scar of hand  Muscle weakness      Subjective Assessment - 06/15/15 1450    Subjective  Doing better - using it more - geting more grip with putty - only tender over palm after  I did a lot of massage to scar - the pinkie still going under my ring finger - not sure if wearing buddy strap correctly    Patient Stated Goals I would like to be able to get my  middle and ring finger straigth , better splnt to use , grip objects like brush, fork, knit again , turn doorknob, open jars , feed my cats ; ect    Currently in Pain? Yes   Pain Score 1    Pain Location Hand   Pain Orientation Left   Pain Descriptors / Indicators Sore   Pain Type Surgical pain   Pain Onset More than a month ago            [facility] OT Assessment - 06/15/15 0001    Strength   Right Hand Grip (lbs) 40   Left Hand Grip (lbs) 21                  OT Treatments/Exercises (OP) - 06/15/15 0001    Hand Exercises   Other Hand Exercises PROM of 3rdand 4th digit- and then PROM to DIP 's, PIP and compostite fist - place and hold -    Other Hand Exercises able to place and hold 4th touching palm - but AROM 5th still touching palm , 4th not - green putty for end range gripping - to increase last 5-10 degrees    Ultrasound   Ultrasound Location palmar scar    Ultrasound Parameters CUS at 3.3MHZ at 0.8  intendsity    Ultrasound Goals Other (Comment)   LUE Paraffin   Number Minutes Paraffin 10 Minutes   LUE Paraffin Location Hand   Comments LMB splint on for 4th to increase PIP extnetion at [facility]    Manual Therapy   Manual therapy comments Scar massage done to palmar adn digital scars - MC's spreads and ABD /webspace stretches after [place] and parafin - traction of 4th PIP with jointmobs prior to extention ROM of PIP 4th                 OT Education - 06/15/15 1457    Education provided Yes   Education Details HEP    Person(s) Educated Patient   Methods Explanation;Demonstration;Tactile cues;Verbal cues;Handout   Comprehension Verbal cues required;Returned demonstration;Verbalized understanding          OT Short Term Goals - 06/15/15 1500    OT SHORT TERM GOAL #1   Title Pt to be in wearing of splint to increaes digits extention and decrease flexoin contracture    Status Achieved   OT SHORT TERM GOAL #2   Title Pt's extention in 3rd and 4th digits increase  by 5-10 degrees to reach in pocket and cabinet    Baseline 5 degrees at 4th more at 3rd    Time 2   Period Weeks   Status On-going   OT SHORT TERM GOAL #3   Title Flexon of digits increase for pt to make full fist to palm to hold sleeve    Baseline can hold with pinkie - not ring and middle yet    Time 2   Period Weeks   Status On-going           OT Long Term Goals - 06/15/15 1501    OT LONG TERM GOAL #1   Title scar tissue improve for pt to tolerate scar massage , pressure to hold tools and kitchen tools in hand for gripping    Status Achieved   OT LONG TERM GOAL #2   Title Grip improve in L hand to 50% compare to R    Baseline 21 lbs L, R 40   Status Achieved               Plan - 06/15/15 1458    Clinical Impression Statement Pt cont to have 5th digit deviating under 4th - with rotation out of PIP - buddystrap not correting it - pt to work on end range for composite fist - to have 4th keep space and align 5th - change putty HEP for ABD of 5th and composite fist -   Pt will benefit from skilled therapeutic intervention in order to improve on the following deficits (Retired) Decreased range of motion;Decreased coordination;Impaired flexibility;Decreased skin integrity;Pain;Impaired UE functional use;Decreased scar mobility;Decreased knowledge of use of DME;Decreased strength   Rehab Potential Good   OT Frequency Biweekly   OT Duration 2 weeks   OT Treatment/Interventions Self-care/ADL training;Parrafin;Moist Heat;Ultrasound;Fluidtherapy;DME and/or AE instruction;Splinting;Patient/family education;Therapeutic exercises;Scar mobilization;Passive range of motion;Manual Therapy   Plan assess progress , HEP - how MD appt went    OT Home Exercise Plan see pt instruction    Consulted and Agree with Plan of Care Patient        Problem List There are no active problems to display for this patient.   Rosalyn Gess OTR/L,CLT  06/15/2015, 3:02 PM  [place] [facility] [address] [place], [address] Phone: [phone]  Fax:  [phone]  Name: Natalie Cooper MRN: [id number] Date of Birth: [birth year]

## 2015-06-15 NOTE — Patient Instructions (Signed)
Putty green for end range - cylinder shape and reach around to decrease 5th curling under 4th  All others  Add for extention - isometric 5th ABD Twisting putty towards her for some ABD of thumb

## 2015-06-17 ENCOUNTER — Encounter: Payer: Commercial Managed Care - HMO | Admitting: Occupational Therapy

## 2015-06-29 ENCOUNTER — Ambulatory Visit: Payer: Commercial Managed Care - HMO | Admitting: Occupational Therapy

## 2015-06-29 DIAGNOSIS — L905 Scar conditions and fibrosis of skin: Secondary | ICD-10-CM | POA: Diagnosis not present

## 2015-06-29 DIAGNOSIS — M6281 Muscle weakness (generalized): Secondary | ICD-10-CM | POA: Diagnosis not present

## 2015-06-29 DIAGNOSIS — M25642 Stiffness of left hand, not elsewhere classified: Secondary | ICD-10-CM | POA: Diagnosis not present

## 2015-06-29 DIAGNOSIS — M79642 Pain in left hand: Secondary | ICD-10-CM

## 2015-06-29 NOTE — Patient Instructions (Signed)
Pt to cont with same HEP since last time  Cont with scar massage at [facility]  scar 4th PIP and at [facility] of 3rd  And palmar scar pad - silicon sleeves for 4th  Cont with night splint  And PIP splint for 4th

## 2015-06-29 NOTE — Therapy (Signed)
[place] [facility] [address] [place], [address] Phone: [phone]   Fax:  [phone]  Occupational Therapy Treatment  Patient Details  Name: Natalie Cooper MRN: [id number] Date of Birth: June 26, 1945 Referring Provider: Margaretmary Eddy   Encounter Date: 06/29/2015      OT End of Session - 06/29/15 1514    Visit Number 7   Number of Visits 7   Date for OT Re-Evaluation 06/29/15   OT Start Time 1300   OT Stop Time 1340   OT Time Calculation (min) 40 min   Activity Tolerance Patient tolerated treatment well   Behavior During Therapy [facility] for tasks assessed/performed      Past Medical History  Diagnosis Date  . Hypertension   . GERD (gastroesophageal reflux disease)   . Osteoporosis   . Headache     MIGRAINES  . Arthritis   . Cancer (HCC)     BASAL CELL   . Stroke ([place]) 1994    HEMORRHAGIC STROKE-HAD GAMMA KNIFE BRAIN PROCEDURE-PT DOES HAVE DOUBLE VISION THAT HAPPENS EVERY 15 MINUTES AND RESOLVES WITHIN 3 MINUTES SINCE HER STROKE IN 1994    Past Surgical History  Procedure Laterality Date  . Abdominal hysterectomy    . Breast enhancement surgery    . Gamma knife brain procedure  1994  . Breast implant removal  2014  . Dupuytren contracture release Left 04/16/2015    Procedure: DUPUYTREN CONTRACTURE RELEASE index and ring finger;  Surgeon: Christophe Louis, MD;  Location: [facility] ORS;  Service: Orthopedics;  Laterality: Left;    There were no vitals filed for this visit.  Visit Diagnosis:  Pain of left hand  Stiffness of finger joint of left hand  Scar of hand  Muscle weakness      Subjective Assessment - 06/29/15 1505    Subjective  I did not seen you for about 2 wks - we wanted to see how my hands does with HEP for 2 wks - I think my ring finger bended more - but happy with my fist - the ring finger do want to rotate some what    Patient Stated Goals I would like to be able to get my middle and  ring finger straigth , better splnt to use , grip objects like brush, fork, knit again , turn doorknob, open jars , feed my cats ; ect    Currently in Pain? Yes   Pain Score 1    Pain Location Hand   Pain Orientation Left   Pain Descriptors / Indicators Sore   Pain Type Surgical pain   Pain Onset More than a month ago            [facility] OT Assessment - 06/29/15 0001    Strength   Right Hand Grip (lbs) 40   Right Hand Lateral Pinch 13 lbs   Right Hand 3 Point Pinch 10 lbs   Left Hand Grip (lbs) 21   Left Hand Lateral Pinch 12 lbs   Left Hand 3 Point Pinch 11 lbs   Left Hand AROM   L Index  MCP 0-90 90 Degrees   L Index PIP 0-100 90 Degrees  -60   L Long  MCP 0-90 90 Degrees   L Long PIP 0-100 90 Degrees  -10   L Ring  MCP 0-90 90 Degrees   L Ring PIP 0-100 95 Degrees  -45 coming in    L Little  MCP 0-90 90  Degrees   L Little PIP 0-100 100 Degrees                  OT Treatments/Exercises (OP) - 06/29/15 0001    Hand Exercises   Other Hand Exercises ROM and grip assess - see flowsheet    Other Hand Exercises  PROM for PIP extention 4th PIP , AROM extention - rollingof putty and review of HEP wtih pt    LUE Paraffin   Number Minutes Paraffin 10 Minutes   LUE Paraffin Location Hand   Comments LMB splint on 4th during parafin to increase PIP extention prior to ther ext    Manual Therapy   Manual therapy comments Scar massage with focus on digits scar at 4th digit, MC scar at 3rd , and palmar scar - fitted with new cica scar pad and 2 digi sleeves for  4th to decrease palmar scar at 4th                 OT Education - 06/29/15 1514    Education provided Yes   Education Details HEP   Person(s) Educated Patient   Methods Explanation;Demonstration;Tactile cues;Verbal cues   Comprehension Verbal cues required;Returned demonstration;Verbalized understanding          OT Short Term Goals - 06/29/15 1517    OT SHORT TERM GOAL #1   Title Pt to be in  wearing of splint to increaes digits extention and decrease flexoin contracture    Status Achieved   OT SHORT TERM GOAL #2   Title Pt's extention in 3rd and 4th digits increase by 5-10 degrees to reach in pocket and cabinet    Baseline 5 degrees at 4th more at 3rd    Status Partially Met   OT SHORT TERM GOAL #3   Title Flexon of digits increase for pt to make full fist to palm to hold sleeve    Baseline can hold with 5th and 3rd    Status Achieved           OT Long Term Goals - 06/29/15 1518    OT LONG TERM GOAL #1   Title scar tissue improve for pt to tolerate scar massage , pressure to hold tools and kitchen tools in hand for gripping    Status Achieved   OT LONG TERM GOAL #2   Title Grip improve in L hand to 50% compare to R    Baseline 21 lbs L, R 40   Status Achieved               Plan - 06/29/15 1515    Clinical Impression Statement Pt decrease in PIP extention compare to last visit - but  was able to get to -35 degrees PROM - pt to cont with HEP and scar massage - night splint for few months - and can get PIP static splint for 4th to wear some during day - pt  pain and functional use of hand imrove greatly - pt met all goals and discharge to cont with HEP    OT Treatment/Interventions Self-care/ADL training;Parrafin;Moist Heat;Ultrasound;Fluidtherapy;DME and/or AE instruction;Splinting;Patient/family education;Therapeutic exercises;Scar mobilization;Passive range of motion;Manual Therapy   Plan DIscharge with HEP andsplint wearing at night time    OT Home Exercise Plan see pt instruction    Consulted and Agree with Plan of Care Patient        Problem List There are no active problems to display for this patient.   Rosalyn Gess OTR/L,CLT  06/29/2015, 3:19 PM  [place] [facility] [address] [place], [address] Phone: [phone]   Fax:  [phone]  Name: Natalie Cooper MRN:  [id number] Date of Birth: [birth year]

## 2015-09-10 DIAGNOSIS — I10 Essential (primary) hypertension: Secondary | ICD-10-CM | POA: Diagnosis not present

## 2015-09-10 DIAGNOSIS — Z0000 Encounter for general adult medical examination without abnormal findings: Secondary | ICD-10-CM | POA: Diagnosis not present

## 2015-09-13 DIAGNOSIS — H18451 Nodular corneal degeneration, right eye: Secondary | ICD-10-CM | POA: Diagnosis not present

## 2015-09-17 DIAGNOSIS — K219 Gastro-esophageal reflux disease without esophagitis: Secondary | ICD-10-CM | POA: Diagnosis not present

## 2015-09-17 DIAGNOSIS — I10 Essential (primary) hypertension: Secondary | ICD-10-CM | POA: Diagnosis not present

## 2015-09-17 DIAGNOSIS — G43809 Other migraine, not intractable, without status migrainosus: Secondary | ICD-10-CM | POA: Diagnosis not present

## 2016-03-21 DIAGNOSIS — E782 Mixed hyperlipidemia: Secondary | ICD-10-CM | POA: Diagnosis not present

## 2016-03-21 DIAGNOSIS — R030 Elevated blood-pressure reading, without diagnosis of hypertension: Secondary | ICD-10-CM | POA: Diagnosis not present

## 2016-03-27 DIAGNOSIS — Z1231 Encounter for screening mammogram for malignant neoplasm of breast: Secondary | ICD-10-CM | POA: Diagnosis not present

## 2016-03-27 DIAGNOSIS — Z0000 Encounter for general adult medical examination without abnormal findings: Secondary | ICD-10-CM | POA: Diagnosis not present

## 2016-03-27 DIAGNOSIS — M810 Age-related osteoporosis without current pathological fracture: Secondary | ICD-10-CM | POA: Diagnosis not present

## 2016-03-27 DIAGNOSIS — M8580 Other specified disorders of bone density and structure, unspecified site: Secondary | ICD-10-CM | POA: Diagnosis not present

## 2016-03-27 DIAGNOSIS — I10 Essential (primary) hypertension: Secondary | ICD-10-CM | POA: Diagnosis not present

## 2016-04-04 ENCOUNTER — Other Ambulatory Visit: Payer: Self-pay | Admitting: Internal Medicine

## 2016-04-04 DIAGNOSIS — Z1231 Encounter for screening mammogram for malignant neoplasm of breast: Secondary | ICD-10-CM

## 2016-05-11 ENCOUNTER — Ambulatory Visit
Admission: RE | Admit: 2016-05-11 | Discharge: 2016-05-11 | Disposition: A | Discharge status: Home / Self Care | Payer: Medicare PPO | Source: Ambulatory Visit | Attending: Internal Medicine | Admitting: Internal Medicine

## 2016-05-11 DIAGNOSIS — Z1231 Encounter for screening mammogram for malignant neoplasm of breast: Secondary | ICD-10-CM | POA: Insufficient documentation

## 2017-01-22 ENCOUNTER — Other Ambulatory Visit: Payer: Self-pay | Admitting: Otolaryngology

## 2017-01-22 ENCOUNTER — Ambulatory Visit
Admission: RE | Admit: 2017-01-22 | Discharge: 2017-01-22 | Disposition: A | Discharge status: Home / Self Care | Payer: Medicare PPO | Source: Ambulatory Visit | Attending: Otolaryngology | Admitting: Otolaryngology

## 2017-01-22 ENCOUNTER — Other Ambulatory Visit
Admission: RE | Admit: 2017-01-22 | Discharge: 2017-01-22 | Disposition: A | Discharge status: Home / Self Care | Payer: Medicare PPO | Source: Ambulatory Visit | Attending: Otolaryngology | Admitting: Otolaryngology

## 2017-01-22 DIAGNOSIS — R05 Cough: Secondary | ICD-10-CM

## 2017-01-22 DIAGNOSIS — R059 Cough, unspecified: Secondary | ICD-10-CM

## 2017-01-24 LAB — MISC LABCORP TEST (SEND OUT): Labcorp test code: 672858

## 2017-07-27 ENCOUNTER — Encounter: Payer: Self-pay | Admitting: *Deleted

## 2017-07-30 ENCOUNTER — Ambulatory Visit: Payer: Medicare PPO | Admitting: Anesthesiology

## 2017-07-30 ENCOUNTER — Encounter
Admission: RE | Disposition: A | Discharge status: Home / Self Care | Payer: Self-pay | Source: Ambulatory Visit | Attending: Unknown Physician Specialty

## 2017-07-30 ENCOUNTER — Encounter: Payer: Self-pay | Admitting: *Deleted

## 2017-07-30 ENCOUNTER — Ambulatory Visit
Admission: RE | Admit: 2017-07-30 | Discharge: 2017-07-30 | Disposition: A | Discharge status: Home / Self Care | Payer: Medicare PPO | Source: Ambulatory Visit | Attending: Unknown Physician Specialty | Admitting: Unknown Physician Specialty

## 2017-07-30 DIAGNOSIS — G43909 Migraine, unspecified, not intractable, without status migrainosus: Secondary | ICD-10-CM | POA: Insufficient documentation

## 2017-07-30 DIAGNOSIS — K219 Gastro-esophageal reflux disease without esophagitis: Secondary | ICD-10-CM | POA: Diagnosis not present

## 2017-07-30 DIAGNOSIS — M810 Age-related osteoporosis without current pathological fracture: Secondary | ICD-10-CM | POA: Diagnosis not present

## 2017-07-30 DIAGNOSIS — Z8673 Personal history of transient ischemic attack (TIA), and cerebral infarction without residual deficits: Secondary | ICD-10-CM | POA: Insufficient documentation

## 2017-07-30 DIAGNOSIS — K640 First degree hemorrhoids: Secondary | ICD-10-CM | POA: Diagnosis not present

## 2017-07-30 DIAGNOSIS — Z90710 Acquired absence of both cervix and uterus: Secondary | ICD-10-CM | POA: Diagnosis not present

## 2017-07-30 DIAGNOSIS — Z1211 Encounter for screening for malignant neoplasm of colon: Secondary | ICD-10-CM | POA: Insufficient documentation

## 2017-07-30 DIAGNOSIS — Z85828 Personal history of other malignant neoplasm of skin: Secondary | ICD-10-CM | POA: Diagnosis not present

## 2017-07-30 DIAGNOSIS — D122 Benign neoplasm of ascending colon: Secondary | ICD-10-CM | POA: Insufficient documentation

## 2017-07-30 DIAGNOSIS — Z7982 Long term (current) use of aspirin: Secondary | ICD-10-CM | POA: Diagnosis not present

## 2017-07-30 DIAGNOSIS — M1990 Unspecified osteoarthritis, unspecified site: Secondary | ICD-10-CM | POA: Insufficient documentation

## 2017-07-30 DIAGNOSIS — Z8249 Family history of ischemic heart disease and other diseases of the circulatory system: Secondary | ICD-10-CM | POA: Diagnosis not present

## 2017-07-30 DIAGNOSIS — I10 Essential (primary) hypertension: Secondary | ICD-10-CM | POA: Insufficient documentation

## 2017-07-30 DIAGNOSIS — K5730 Diverticulosis of large intestine without perforation or abscess without bleeding: Secondary | ICD-10-CM | POA: Insufficient documentation

## 2017-07-30 DIAGNOSIS — Z888 Allergy status to other drugs, medicaments and biological substances status: Secondary | ICD-10-CM | POA: Insufficient documentation

## 2017-07-30 DIAGNOSIS — Z79899 Other long term (current) drug therapy: Secondary | ICD-10-CM | POA: Diagnosis not present

## 2017-07-30 DIAGNOSIS — H532 Diplopia: Secondary | ICD-10-CM | POA: Insufficient documentation

## 2017-07-30 DIAGNOSIS — Z8262 Family history of osteoporosis: Secondary | ICD-10-CM | POA: Diagnosis not present

## 2017-07-30 DIAGNOSIS — E559 Vitamin D deficiency, unspecified: Secondary | ICD-10-CM | POA: Insufficient documentation

## 2017-07-30 HISTORY — PX: COLONOSCOPY WITH PROPOFOL: SHX5780

## 2017-07-30 HISTORY — DX: Vitamin D deficiency, unspecified: E55.9

## 2017-07-30 SURGERY — COLONOSCOPY WITH PROPOFOL
Anesthesia: General

## 2017-07-30 MED ORDER — PROPOFOL 10 MG/ML IV BOLUS
INTRAVENOUS | Status: AC
  Filled 2017-07-30: qty 20

## 2017-07-30 MED ORDER — SODIUM CHLORIDE 0.9 % IV SOLN
INTRAVENOUS | Status: DC
  Administered 2017-07-30: 14:00:00 via INTRAVENOUS

## 2017-07-30 MED ORDER — MIDAZOLAM HCL 2 MG/2ML IJ SOLN
INTRAMUSCULAR | Status: AC
  Filled 2017-07-30: qty 2

## 2017-07-30 MED ORDER — SODIUM CHLORIDE 0.9 % IV SOLN: INTRAVENOUS | Status: DC

## 2017-07-30 MED ORDER — PROPOFOL 500 MG/50ML IV EMUL
INTRAVENOUS | Status: DC | PRN
  Administered 2017-07-30: 140 ug/kg/min via INTRAVENOUS

## 2017-07-30 MED ORDER — MIDAZOLAM HCL 2 MG/2ML IJ SOLN
INTRAMUSCULAR | Status: DC | PRN
  Administered 2017-07-30: 1 mg via INTRAVENOUS

## 2017-07-30 MED ORDER — LIDOCAINE HCL (CARDIAC) 20 MG/ML IV SOLN
INTRAVENOUS | Status: DC | PRN
  Administered 2017-07-30: 40 mg via INTRAVENOUS

## 2017-07-30 MED ORDER — EPHEDRINE SULFATE 50 MG/ML IJ SOLN
INTRAMUSCULAR | Status: DC | PRN
  Administered 2017-07-30: 10 mg via INTRAVENOUS

## 2017-07-30 MED ORDER — PROPOFOL 10 MG/ML IV BOLUS
INTRAVENOUS | Status: DC | PRN
  Administered 2017-07-30: 40 mg via INTRAVENOUS

## 2017-07-30 NOTE — Op Note (Signed)
[facility] Gastroenterology Patient Name: Nonie Lochner Procedure Date: 07/30/2017 3:00 PM MRN: [id number] Account #: [id number] Date of Birth: 06-Feb-1946 Admit Type: Outpatient Age: 72 Room: [facility] ENDO ROOM 1 Gender: Female Note Status: Finalized Procedure:            Colonoscopy Indications:          Screening for colorectal malignant neoplasm Providers:            Manya Silvas, MD Referring MD:         Ocie Cornfield. Ouida Sills MD, MD (Referring MD) Medicines:            Propofol per Anesthesia Complications:        No immediate complications. Procedure:            Pre-Anesthesia Assessment:                       - After reviewing the risks and benefits, the patient                        was deemed in satisfactory condition to undergo the                        procedure.                       After obtaining informed consent, the colonoscope was                        passed under direct vision. Throughout the procedure,                        the patient's blood pressure, pulse, and oxygen                        saturations were monitored continuously. The                        Colonoscope was introduced through the anus and                        advanced to the the cecum, identified by appendiceal                        orifice and ileocecal valve. The colonoscopy was                        performed without difficulty. The patient tolerated the                        procedure well. The quality of the bowel preparation                        was good. Findings:      Cecum reached for certainty but forgot to do picture.      A diminutive polyp was found in the ascending colon. The polyp was       sessile. The polyp was removed with a hot snare. A tiny piece was       recovered, remainder shattered.      A few small-mouthed diverticula were found in the sigmoid colon.      Internal  hemorrhoids were found during endoscopy. The hemorrhoids were       small and  Grade I (internal hemorrhoids that do not prolapse). Impression:           - One diminutive polyp in the ascending colon, removed                        with a hot snare. Resected and retrieved.                       - Diverticulosis in the sigmoid colon.                       - Internal hemorrhoids. Recommendation:       - Await pathology results. Manya Silvas, MD 07/30/2017 3:41:02 PM This report has been signed electronically. Number of Addenda: 0 Note Initiated On: 07/30/2017 3:00 PM Scope Withdrawal Time: 0 hours 13 minutes 45 seconds  Total Procedure Duration: 0 hours 27 minutes 55 seconds       [facility]

## 2017-07-30 NOTE — Transfer of Care (Signed)
Immediate Anesthesia Transfer of Care Note  Patient: Natalie Cooper  Procedure(s) Performed: COLONOSCOPY WITH PROPOFOL (N/A )  Patient Location: PACU  Anesthesia Type:General  Level of Consciousness: awake  Airway & Oxygen Therapy: Patient Spontanous Breathing and Patient connected to nasal cannula oxygen  Post-op Assessment: Report given to RN and Post -op Vital signs reviewed and stable  Post vital signs: Reviewed and stable  Last Vitals:  Vitals Value Taken Time  BP 109/59 07/30/2017  3:46 PM  Temp 36.4 C 07/30/2017  3:46 PM  Pulse 78 07/30/2017  3:47 PM  Resp 17 07/30/2017  3:47 PM  SpO2 99 % 07/30/2017  3:47 PM  Vitals shown include unvalidated device data.  Last Pain:  Vitals:   07/30/17 1546  TempSrc:   PainSc: 0-No pain         Complications: No apparent anesthesia complications

## 2017-07-30 NOTE — H&P (Signed)
Primary Care Physician:  Kirk Ruths, MD Primary Gastroenterologist:  Dr. Vira Agar  Pre-Procedure History & Physical: HPI:  Natalie Cooper is a 72 y.o. female is here for an colonoscopy for screening.   Past Medical History:  Diagnosis Date  . Arthritis   . Cancer (HCC)    BASAL CELL   . GERD (gastroesophageal reflux disease)   . Headache    MIGRAINES  . Hypertension   . Osteoporosis   . Stroke ([place]) 1994   HEMORRHAGIC STROKE-HAD GAMMA KNIFE BRAIN PROCEDURE-PT DOES HAVE DOUBLE VISION THAT HAPPENS EVERY 15 MINUTES AND RESOLVES WITHIN 3 MINUTES SINCE HER STROKE IN 40  . Vitamin D deficiency     Past Surgical History:  Procedure Laterality Date  . ABDOMINAL HYSTERECTOMY    . BREAST ENHANCEMENT SURGERY    . BREAST IMPLANT REMOVAL  2014  . DUPUYTREN CONTRACTURE RELEASE Left 04/16/2015   Procedure: DUPUYTREN CONTRACTURE RELEASE index and ring finger;  Surgeon: Christophe Louis, MD;  Location: [facility] ORS;  Service: Orthopedics;  Laterality: Left;  . GAMMA KNIFE BRAIN PROCEDURE  1994    Prior to Admission medications   Medication Sig Start Date End Date Taking? Authorizing Provider  alendronate (FOSAMAX) 70 MG tablet Take 70 mg by mouth once a week. Take with a full glass of water on an empty stomach.   Yes [provider]  aspirin 81 MG tablet Take 81 mg by mouth daily.   Yes [provider]  Calcium-Magnesium-Vitamin D (CALCIUM 500 PO) Take 2 tablets by mouth daily at 12 noon.   Yes [provider]  Cholecalciferol 2000 units CAPS Take 2,000 Units by mouth daily.   Yes [provider]  fluticasone (FLONASE) 50 MCG/ACT nasal spray Place 2 sprays into both nostrils daily.   Yes [provider]  ibuprofen (ADVIL,MOTRIN) 200 MG tablet Take 200 mg by mouth every 6 (six) hours as needed.   Yes [provider]  lisinopril (PRINIVIL,ZESTRIL) 10 MG tablet Take 10 mg by mouth daily.   Yes [provider]  Multiple  Vitamin (MULTIVITAMIN) capsule Take 1 capsule by mouth daily.   Yes [provider]  naproxen sodium (ALEVE) 220 MG tablet Take 220 mg by mouth as needed.   Yes [provider]  azelastine (ASTELIN) 0.1 % nasal spray Place 1 spray into both nostrils 2 (two) times daily. Use in each nostril as directed    [provider]  HYDROcodone-acetaminophen (NORCO) 5-325 MG tablet Take 1 tablet by mouth every 6 (six) hours as needed for moderate pain. Patient not taking: Reported on 07/30/2017 04/16/15   Christophe Louis, MD  Olopatadine HCl 0.7 % SOLN Apply 0.7 % to eye daily.    [provider]    Allergies as of 06/16/2017 - Review Complete 05/26/2015  Allergen Reaction Noted  . Nsaids  04/08/2015    Family History  Problem Relation Age of Onset  . Hypertension Mother   . Osteoporosis Mother   . Hypertension Father   . Breast cancer Neg Hx     Social History   Socioeconomic History  . Marital status: Married    Spouse name: Not on file  . Number of children: Not on file  . Years of education: Not on file  . Highest education level: Not on file  Occupational History  . Not on file  Social Needs  . Financial resource strain: Not on file  . Food insecurity:    Worry: Not on file  Inability: Not on file  . Transportation needs:    Medical: Not on file    Non-medical: Not on file  Tobacco Use  . Smoking status: Never Smoker  . Smokeless tobacco: Never Used  Substance and Sexual Activity  . Alcohol use: Yes    Comment: RARE  . Drug use: No  . Sexual activity: Not on file    Comment: Married   Lifestyle  . Physical activity:    Days per week: Not on file    Minutes per session: Not on file  . Stress: Not on file  Relationships  . Social connections:    Talks on phone: Not on file    Gets together: Not on file    Attends religious service: Not on file    Active member of club or organization: Not on file    Attends meetings of clubs or  organizations: Not on file    Relationship status: Not on file  . Intimate partner violence:    Fear of current or ex partner: Not on file    Emotionally abused: Not on file    Physically abused: Not on file    Forced sexual activity: Not on file  Other Topics Concern  . Not on file  Social History Narrative  . Not on file    Review of Systems: See HPI, otherwise negative ROS  Physical Exam: BP (!) 152/95   Pulse 72   Temp (!) 97.3 F (36.3 C) (Tympanic)   Resp 16   Ht 5\' 3"  (1.6 m)   Wt 62.6 kg (138 lb)   SpO2 100%   BMI 24.45 kg/m  General:   Alert,  pleasant and cooperative in NAD Head:  Normocephalic and atraumatic. Neck:  Supple; no masses or thyromegaly. Lungs:  Clear throughout to auscultation.    Heart:  Regular rate and rhythm. Abdomen:  Soft, nontender and nondistended. Normal bowel sounds, without guarding, and without rebound.   Neurologic:  Alert and  oriented x4;  grossly normal neurologically.  Impression/Plan: Natalie Cooper is here for an colonoscopy to be performed for screening colonoscopy  Risks, benefits, limitations, and alternatives regarding  colonoscopy have been reviewed with the patient.  Questions have been answered.  All parties agreeable.   Gaylyn Cheers, MD  07/30/2017, 2:57 PM

## 2017-07-30 NOTE — Anesthesia Preprocedure Evaluation (Signed)
Anesthesia Evaluation  Patient identified by MRN, date of birth, ID band Patient awake    Reviewed: Allergy & Precautions, H&P , NPO status , Patient's Chart, lab work & pertinent test results, reviewed documented beta blocker date and time   Airway Mallampati: II  TM Distance: >3 FB Neck ROM: full    Dental no notable dental hx. (+) Teeth Intact   Pulmonary neg pulmonary ROS,    Pulmonary exam normal breath sounds clear to auscultation       Cardiovascular Exercise Tolerance: Good hypertension, negative cardio ROS Normal cardiovascular exam Rhythm:regular Rate:Normal     Neuro/Psych  Headaches, Periodic double vision but no upper or lower extr loss of function described CVA, Residual Symptoms negative neurological ROS  negative psych ROS   GI/Hepatic negative GI ROS, Neg liver ROS, GERD  ,  Endo/Other  negative endocrine ROS  Renal/GU negative Renal ROS  negative genitourinary   Musculoskeletal  (+) Arthritis ,   Abdominal   Peds  Hematology negative hematology ROS (+)   Anesthesia Other Findings Past Medical History: No date: Arthritis No date: Cancer (HCC)     Comment:  BASAL CELL  No date: GERD (gastroesophageal reflux disease) No date: Headache     Comment:  MIGRAINES No date: Hypertension No date: Osteoporosis 1994: Stroke ([place])     Comment:  HEMORRHAGIC STROKE-HAD GAMMA KNIFE BRAIN PROCEDURE-PT               DOES HAVE DOUBLE VISION THAT HAPPENS EVERY 15 MINUTES AND              RESOLVES WITHIN 3 MINUTES SINCE HER STROKE IN 1994 No date: Vitamin D deficiency  Reproductive/Obstetrics negative OB ROS                             Anesthesia Physical  Anesthesia Plan  ASA: III  Anesthesia Plan: General   Post-op Pain Management:    Induction: Intravenous  PONV Risk Score and Plan:   Airway Management Planned: Nasal Cannula  Additional Equipment:   Intra-op Plan:    Post-operative Plan:   Informed Consent: I have reviewed the patients History and Physical, chart, labs and discussed the procedure including the risks, benefits and alternatives for the proposed anesthesia with the patient or authorized representative who has indicated his/her understanding and acceptance.   Dental Advisory Given  Plan Discussed with: CRNA  Anesthesia Plan Comments: (Refuses regional or bier block.  )        Anesthesia Quick Evaluation

## 2017-07-30 NOTE — Anesthesia Post-op Follow-up Note (Signed)
Anesthesia QCDR form completed.        

## 2017-07-31 NOTE — Anesthesia Postprocedure Evaluation (Signed)
Anesthesia Post Note  Patient: Natalie Cooper  Procedure(s) Performed: COLONOSCOPY WITH PROPOFOL (N/A )  Patient location during evaluation: PACU Anesthesia Type: General Level of consciousness: awake and alert Pain management: pain level controlled Vital Signs Assessment: post-procedure vital signs reviewed and stable Respiratory status: spontaneous breathing, nonlabored ventilation, respiratory function stable and patient connected to nasal cannula oxygen Cardiovascular status: blood pressure returned to baseline and stable Postop Assessment: no apparent nausea or vomiting Anesthetic complications: no     Last Vitals:  Vitals:   07/30/17 1556 07/30/17 1606  BP: 126/81 130/79  Pulse: 70 64  Resp: 14 16  Temp:    SpO2: 100% 99%    Last Pain:  Vitals:   07/30/17 1606  TempSrc:   PainSc: 0-No pain                 Molli Barrows

## 2017-08-02 LAB — SURGICAL PATHOLOGY

## 2018-05-16 ENCOUNTER — Other Ambulatory Visit: Payer: Self-pay | Admitting: Internal Medicine

## 2018-05-16 DIAGNOSIS — Z1231 Encounter for screening mammogram for malignant neoplasm of breast: Secondary | ICD-10-CM

## 2018-05-31 ENCOUNTER — Ambulatory Visit
Admission: RE | Admit: 2018-05-31 | Discharge: 2018-05-31 | Disposition: A | Discharge status: Home / Self Care | Payer: Medicare PPO | Source: Ambulatory Visit | Attending: Internal Medicine | Admitting: Internal Medicine

## 2018-05-31 DIAGNOSIS — Z1231 Encounter for screening mammogram for malignant neoplasm of breast: Secondary | ICD-10-CM

## 2018-06-27 ENCOUNTER — Other Ambulatory Visit: Payer: Self-pay | Admitting: Student

## 2018-06-27 DIAGNOSIS — R1084 Generalized abdominal pain: Secondary | ICD-10-CM

## 2018-07-01 ENCOUNTER — Other Ambulatory Visit: Payer: Self-pay

## 2018-07-01 ENCOUNTER — Ambulatory Visit
Admission: RE | Admit: 2018-07-01 | Discharge: 2018-07-01 | Disposition: A | Discharge status: Home / Self Care | Payer: Medicare PPO | Source: Ambulatory Visit | Attending: Student | Admitting: Student

## 2018-07-01 DIAGNOSIS — R1084 Generalized abdominal pain: Secondary | ICD-10-CM | POA: Insufficient documentation

## 2018-07-01 MED ORDER — IOHEXOL 300 MG/ML  SOLN
100.0000 mL | Freq: Once | INTRAMUSCULAR | Status: AC | PRN
  Administered 2018-07-01: 100 mL via INTRAVENOUS

## 2018-09-16 IMAGING — CR DG CHEST 2V
1 series · 2 of 2 positions shown · non-contrast
Comparison: None.

CLINICAL DATA: Chronic cough for 6 months.

EXAM:
CHEST  2 VIEW

[Series 1: dg chest 2 view · 0.14mm/px · 2 of 2 slices shown]
[im 1/2]
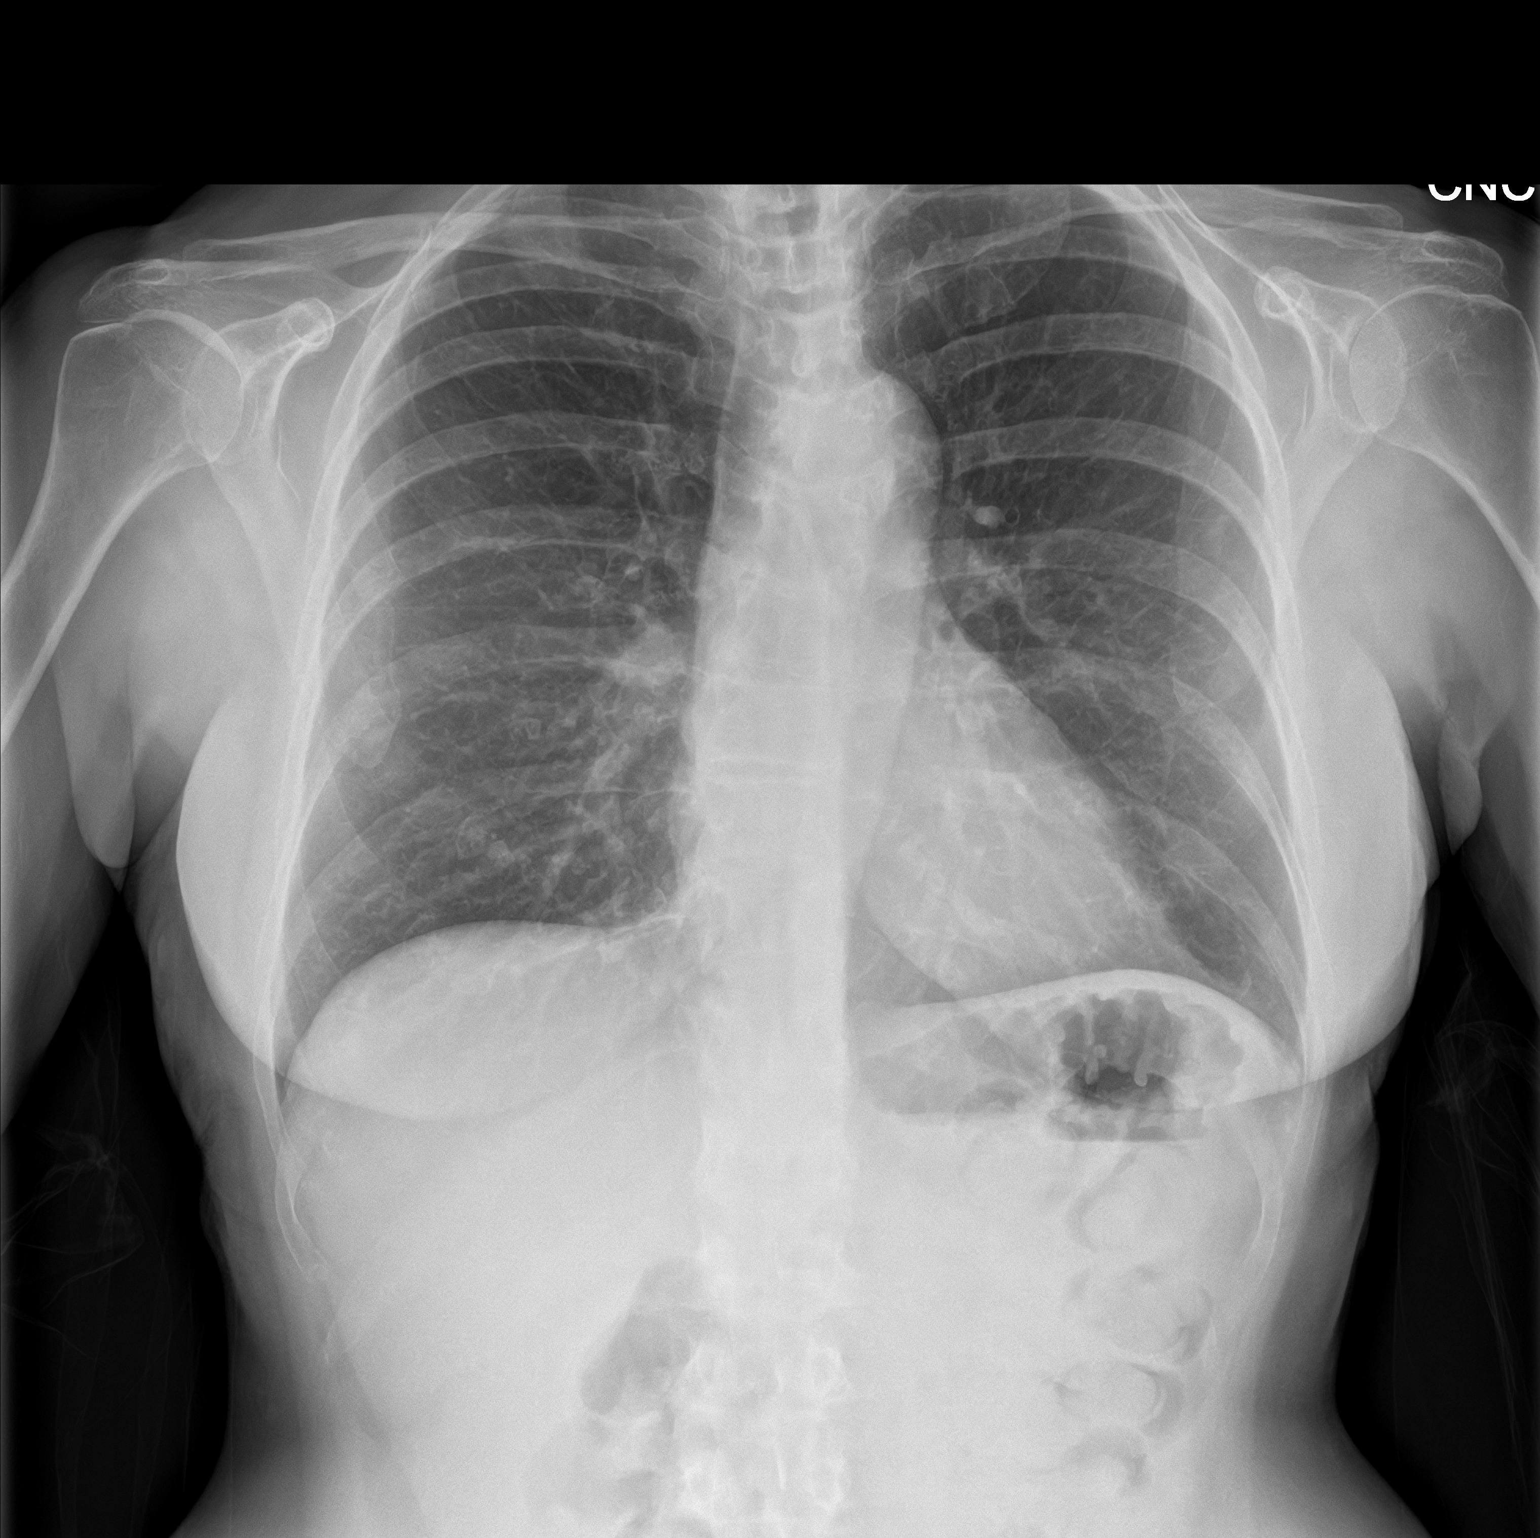
[im 2/2]
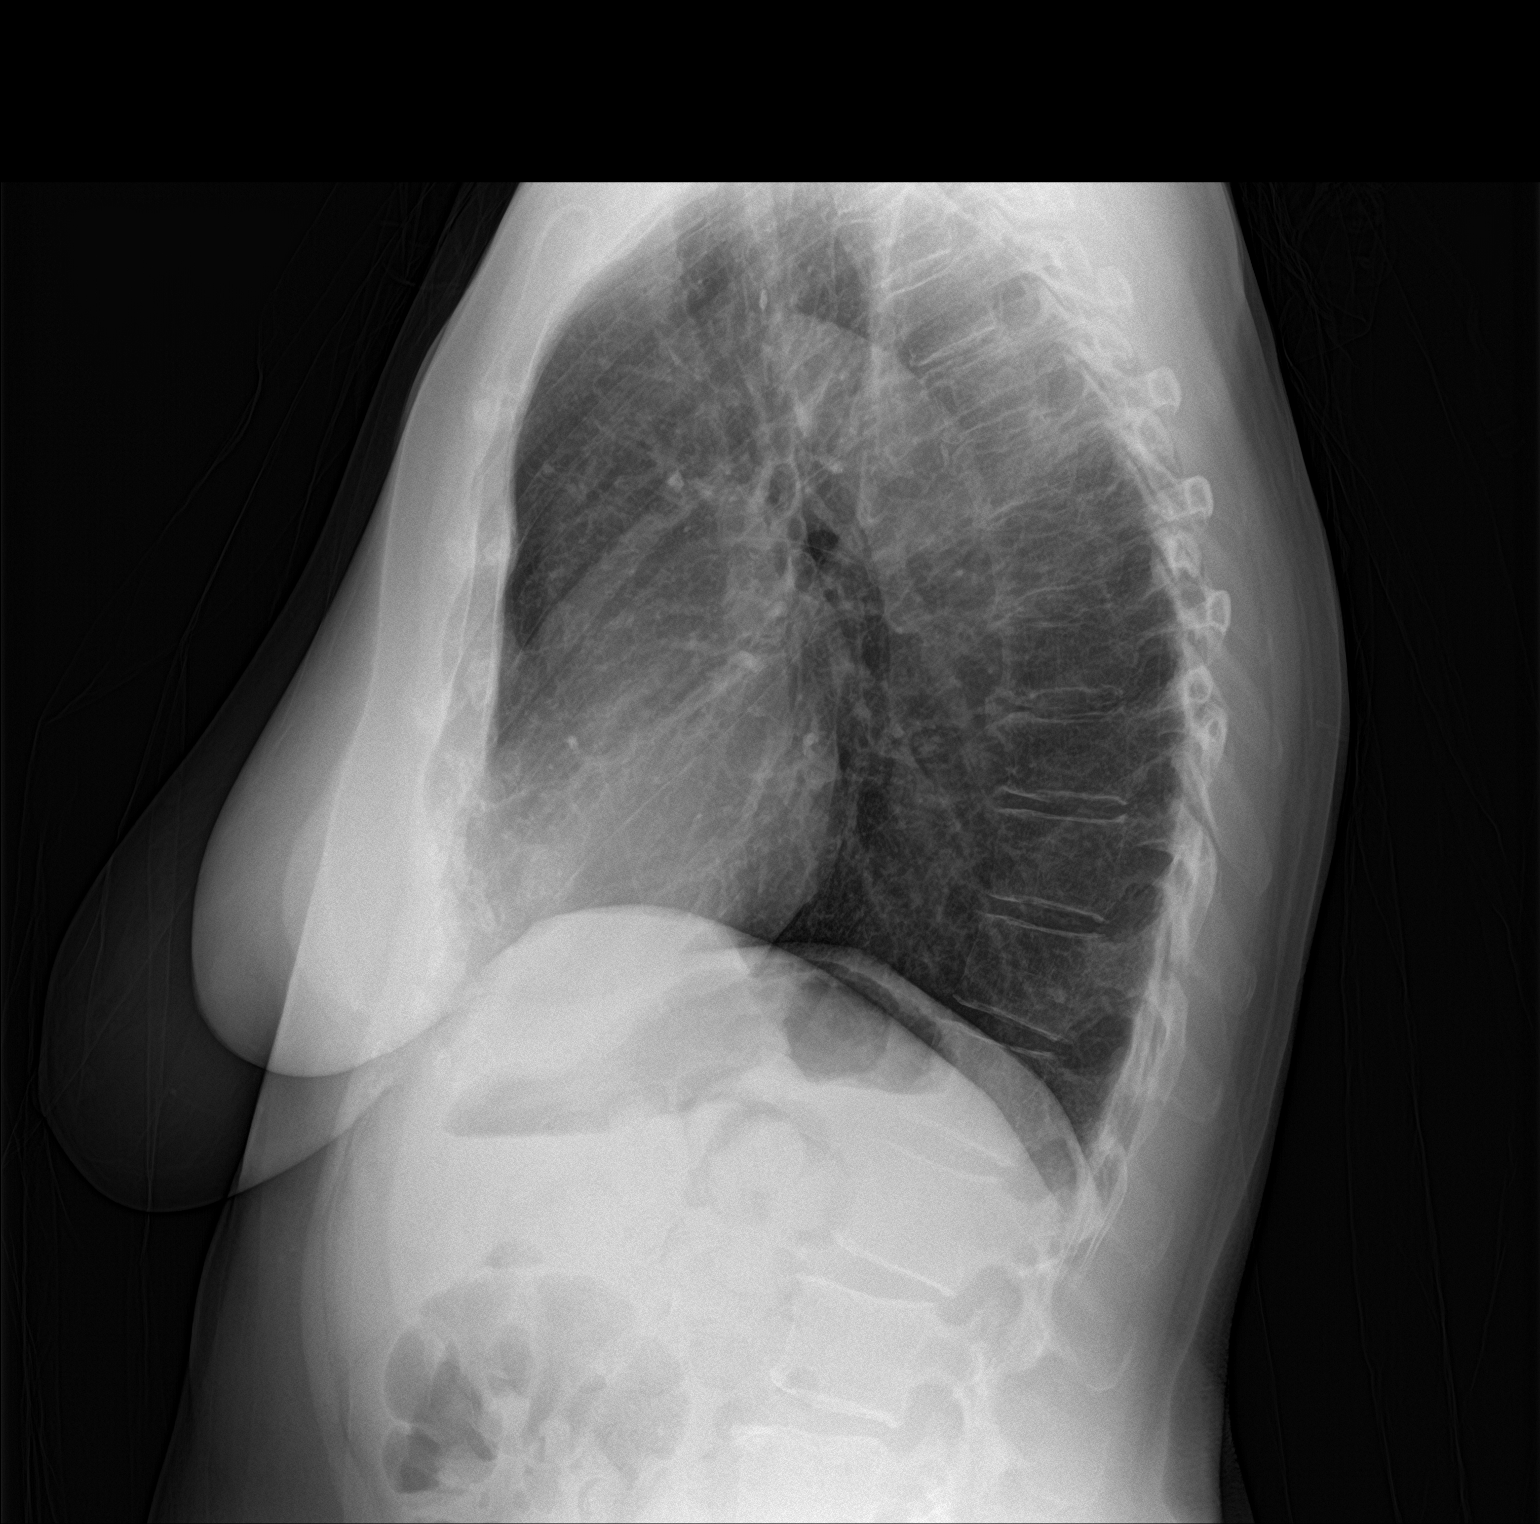

[2 of 2 positions shown; findings below may reference images not displayed]

FINDINGS: The cardiomediastinal silhouette is unremarkable.

There is no evidence of focal airspace disease, pulmonary edema,
suspicious pulmonary nodule/mass, pleural effusion, or pneumothorax.
No acute bony abnormalities are identified.
IMPRESSION: No active cardiopulmonary disease.

## 2019-05-07 DIAGNOSIS — Z0000 Encounter for general adult medical examination without abnormal findings: Secondary | ICD-10-CM | POA: Diagnosis not present

## 2019-05-07 DIAGNOSIS — I10 Essential (primary) hypertension: Secondary | ICD-10-CM | POA: Diagnosis not present

## 2019-05-16 DIAGNOSIS — I10 Essential (primary) hypertension: Secondary | ICD-10-CM | POA: Diagnosis not present

## 2019-05-16 DIAGNOSIS — Z0000 Encounter for general adult medical examination without abnormal findings: Secondary | ICD-10-CM | POA: Diagnosis not present

## 2019-05-16 DIAGNOSIS — G43809 Other migraine, not intractable, without status migrainosus: Secondary | ICD-10-CM | POA: Diagnosis not present

## 2019-12-09 DIAGNOSIS — H04123 Dry eye syndrome of bilateral lacrimal glands: Secondary | ICD-10-CM | POA: Diagnosis not present

## 2020-02-23 IMAGING — CT CT ABD-PELV W/ CM
2 of 5 series · 16 of 46 positions shown, 18 images · IV contrast (omnipaque)
Comparison: None.

CLINICAL DATA: Abdominal pain for 2 weeks.

EXAM:
CT ABDOMEN AND PELVIS WITH CONTRAST
TECHNIQUE: Multidetector CT imaging of the abdomen and pelvis was performed
using the standard protocol following bolus administration of
intravenous contrast.
CONTRAST:  100 mL OMNIPAQUE IOHEXOL 300 MG/ML  SOLN

[Series 2: abd pelvis · axial · 0.70mm/px · z∈[-1661,-1241]mm · 13 of 94 slices shown, 15 images (1 of 2)]
[im 5/94  soft-tissue]
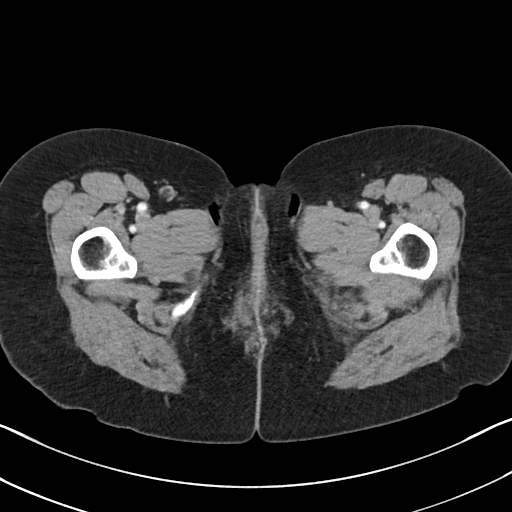
[im 5/94  bone]
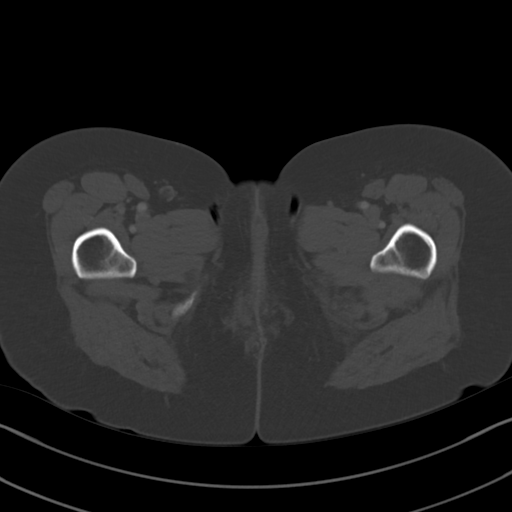
[im 14/94  soft-tissue]
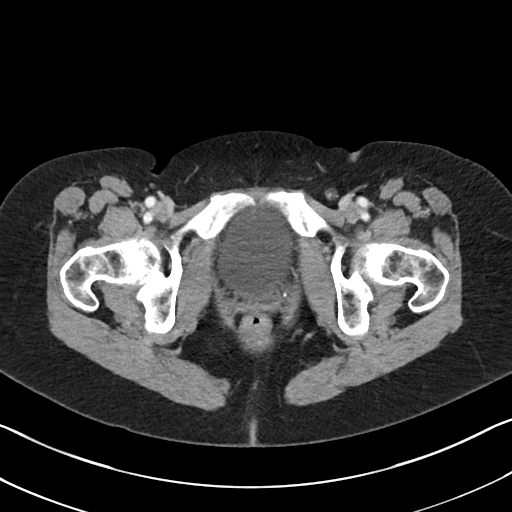
[im 19/94  soft-tissue]
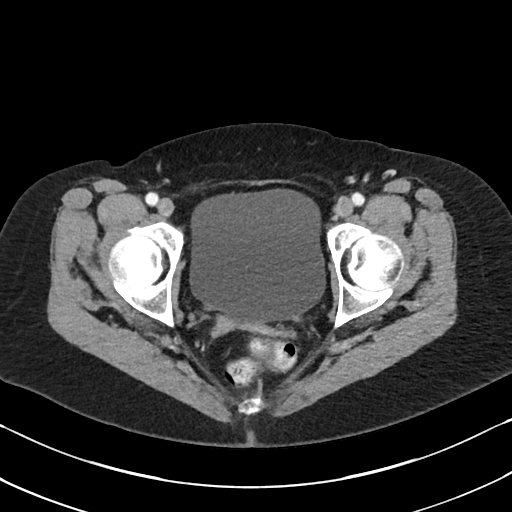
[im 28/94  soft-tissue]
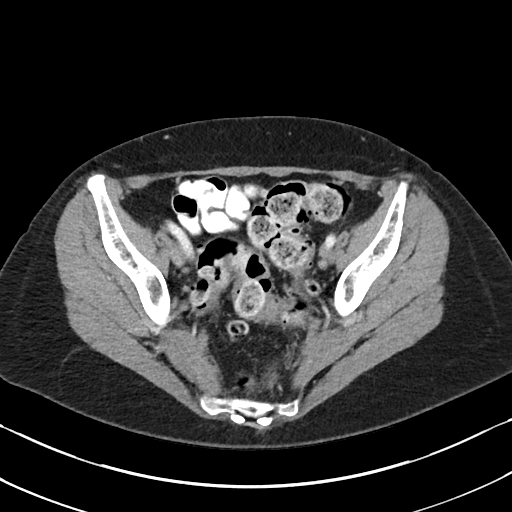
[im 33/94  soft-tissue]
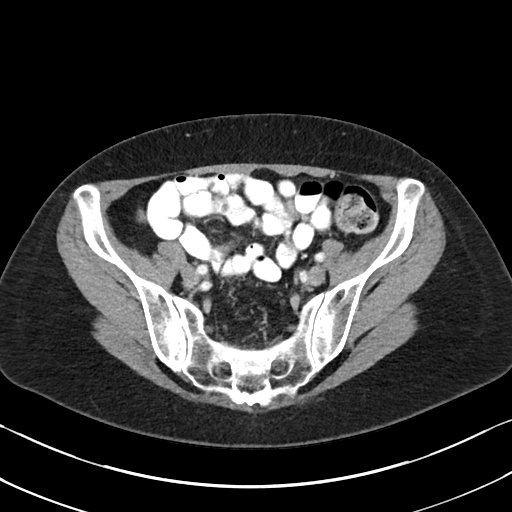
[im 42/94  soft-tissue]
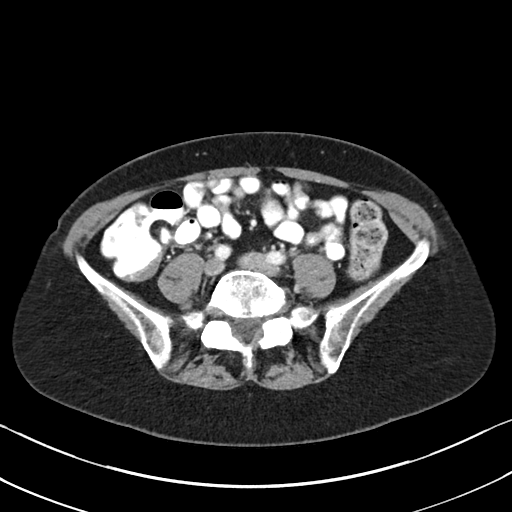
[im 47/94  soft-tissue]
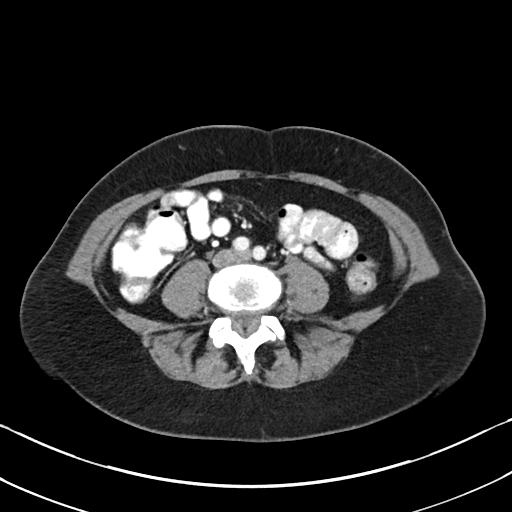
[im 52/94  soft-tissue]
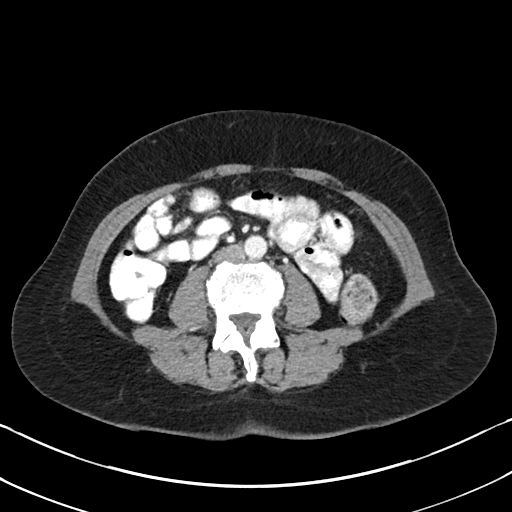
[im 61/94  soft-tissue]
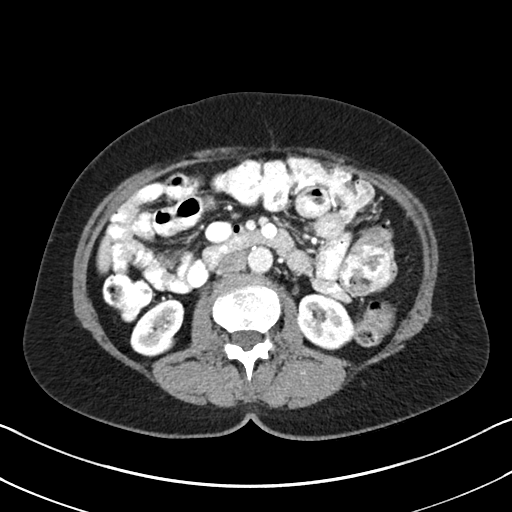
[im 61/94  bone]
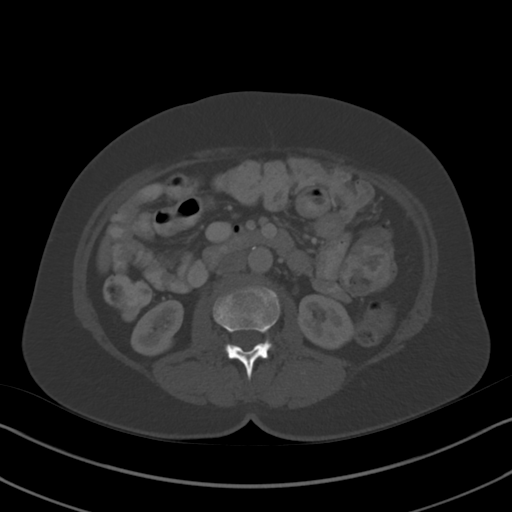
[im 66/94  soft-tissue]
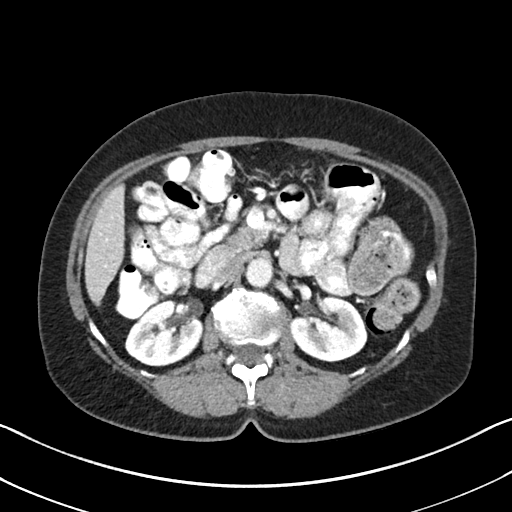
[im 75/94  soft-tissue]
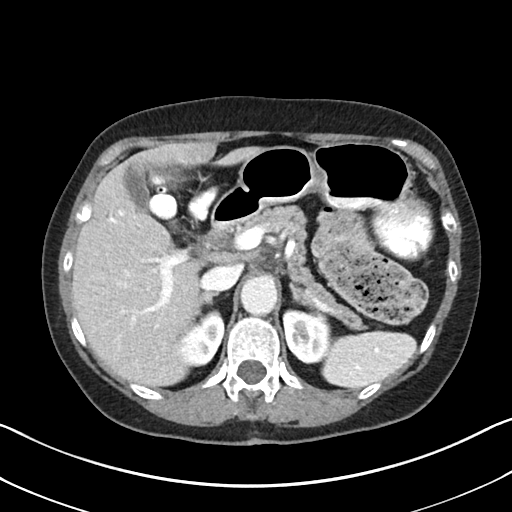
[im 80/94  soft-tissue]
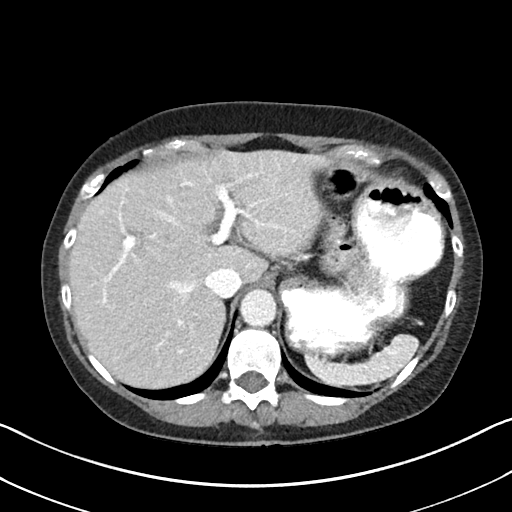
[im 89/94  soft-tissue]
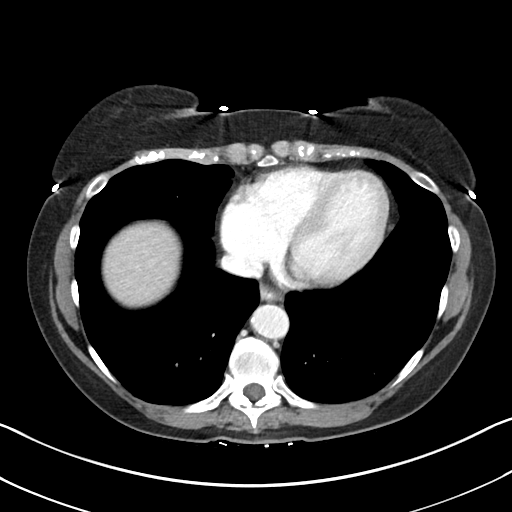

[Series 4: abd pelvis · coronal · 0.70mm/px · 3 of 131 slices shown (2 of 2)]
[im 44/131  soft-tissue]
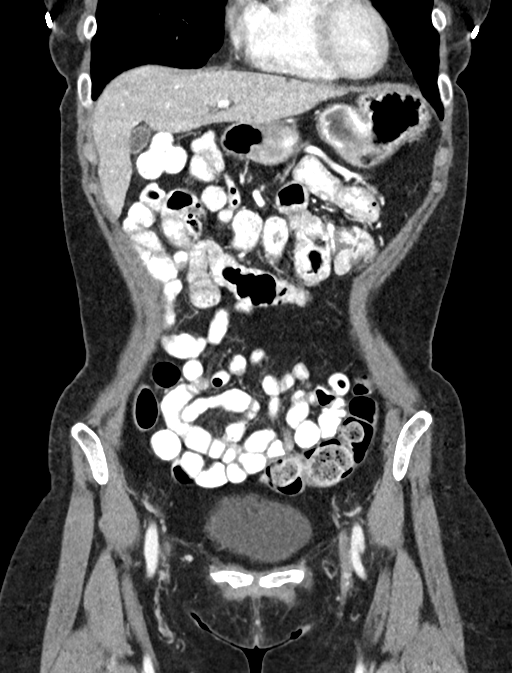
[im 58/131  soft-tissue]
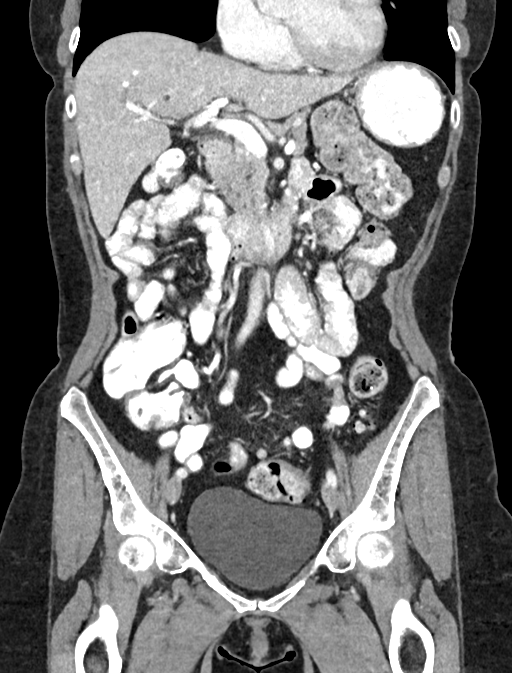
[im 73/131  soft-tissue]
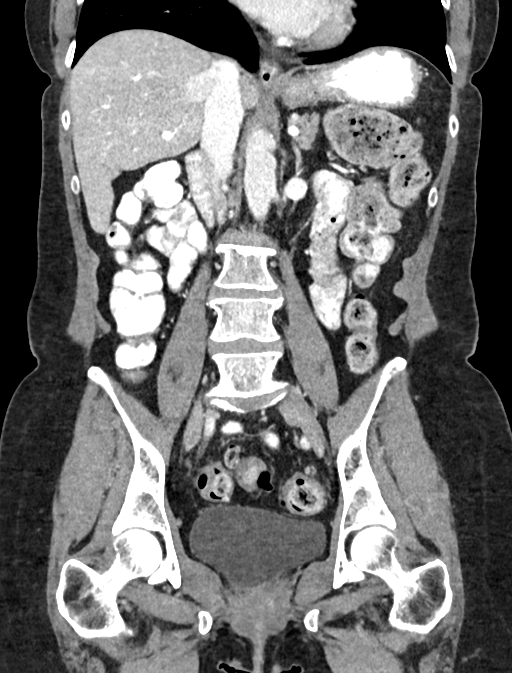

[16 of 46 positions shown; findings below may reference images not displayed]

FINDINGS: Lower chest: Lung bases clear. No pleural or pericardial effusion.
Heart size is normal.

Hepatobiliary: A few punctate hypoattenuating lesions in the liver
are too small to definitively characterize but most likely represent
benign cysts. The liver is otherwise unremarkable. Gallbladder and
biliary tree appear normal.

Pancreas: Unremarkable. No pancreatic ductal dilatation or
surrounding inflammatory changes.

Spleen: Normal in size. 2.3 cm cyst in the spleen is incidentally
noted.

Adrenals/Urinary Tract: 2-3 small cysts are seen in the left kidney.
The kidneys otherwise appear normal. Ureters and urinary bladder are
normal. Adrenal glands are normal.

Stomach/Bowel: Stomach is within normal limits. Appendix appears
normal. No evidence of bowel wall thickening, distention, or
inflammatory changes.

Vascular/Lymphatic: Aortic atherosclerosis. No enlarged abdominal or
pelvic lymph nodes.

Reproductive: Status post hysterectomy. No adnexal masses.

Other: None.

Musculoskeletal: Negative.
IMPRESSION: Negative CT abdomen and pelvis. No finding to explain the patient's
symptoms.

## 2020-03-16 DIAGNOSIS — H04123 Dry eye syndrome of bilateral lacrimal glands: Secondary | ICD-10-CM | POA: Diagnosis not present

## 2020-03-16 DIAGNOSIS — H26493 Other secondary cataract, bilateral: Secondary | ICD-10-CM | POA: Diagnosis not present

## 2020-03-30 DIAGNOSIS — M189 Osteoarthritis of first carpometacarpal joint, unspecified: Secondary | ICD-10-CM | POA: Diagnosis not present

## 2020-05-14 DIAGNOSIS — Z0000 Encounter for general adult medical examination without abnormal findings: Secondary | ICD-10-CM | POA: Diagnosis not present

## 2020-05-14 DIAGNOSIS — I10 Essential (primary) hypertension: Secondary | ICD-10-CM | POA: Diagnosis not present

## 2020-06-07 DIAGNOSIS — H18451 Nodular corneal degeneration, right eye: Secondary | ICD-10-CM | POA: Diagnosis not present

## 2020-07-06 DIAGNOSIS — Z20822 Contact with and (suspected) exposure to covid-19: Secondary | ICD-10-CM | POA: Diagnosis not present

## 2020-09-02 DIAGNOSIS — Z1231 Encounter for screening mammogram for malignant neoplasm of breast: Secondary | ICD-10-CM | POA: Diagnosis not present

## 2020-09-02 DIAGNOSIS — M810 Age-related osteoporosis without current pathological fracture: Secondary | ICD-10-CM | POA: Diagnosis not present

## 2020-09-02 DIAGNOSIS — Z0000 Encounter for general adult medical examination without abnormal findings: Secondary | ICD-10-CM | POA: Diagnosis not present

## 2020-09-02 DIAGNOSIS — I10 Essential (primary) hypertension: Secondary | ICD-10-CM | POA: Diagnosis not present

## 2020-09-02 DIAGNOSIS — G43809 Other migraine, not intractable, without status migrainosus: Secondary | ICD-10-CM | POA: Diagnosis not present

## 2020-09-03 ENCOUNTER — Other Ambulatory Visit: Payer: Self-pay | Admitting: Internal Medicine

## 2020-09-03 DIAGNOSIS — Z1231 Encounter for screening mammogram for malignant neoplasm of breast: Secondary | ICD-10-CM

## 2020-09-06 DIAGNOSIS — M3501 Sicca syndrome with keratoconjunctivitis: Secondary | ICD-10-CM | POA: Diagnosis not present

## 2020-11-10 ENCOUNTER — Ambulatory Visit
Admission: RE | Admit: 2020-11-10 | Discharge: 2020-11-10 | Disposition: A | Discharge status: Home / Self Care | Payer: Medicare HMO | Source: Ambulatory Visit | Attending: Internal Medicine | Admitting: Internal Medicine

## 2020-11-10 ENCOUNTER — Other Ambulatory Visit: Payer: Self-pay

## 2020-11-10 DIAGNOSIS — Z1231 Encounter for screening mammogram for malignant neoplasm of breast: Secondary | ICD-10-CM | POA: Diagnosis not present

## 2020-11-17 ENCOUNTER — Other Ambulatory Visit: Payer: Self-pay | Admitting: Internal Medicine

## 2020-11-17 DIAGNOSIS — R928 Other abnormal and inconclusive findings on diagnostic imaging of breast: Secondary | ICD-10-CM

## 2020-11-17 DIAGNOSIS — N6489 Other specified disorders of breast: Secondary | ICD-10-CM

## 2020-11-22 ENCOUNTER — Ambulatory Visit
Admission: RE | Admit: 2020-11-22 | Discharge: 2020-11-22 | Disposition: A | Discharge status: Home / Self Care | Payer: Medicare HMO | Source: Ambulatory Visit | Attending: Internal Medicine | Admitting: Internal Medicine

## 2020-11-22 ENCOUNTER — Other Ambulatory Visit: Payer: Self-pay

## 2020-11-22 DIAGNOSIS — N6489 Other specified disorders of breast: Secondary | ICD-10-CM

## 2020-11-22 DIAGNOSIS — R928 Other abnormal and inconclusive findings on diagnostic imaging of breast: Secondary | ICD-10-CM

## 2020-11-22 DIAGNOSIS — R922 Inconclusive mammogram: Secondary | ICD-10-CM | POA: Diagnosis not present

## 2020-12-10 DIAGNOSIS — M3501 Sicca syndrome with keratoconjunctivitis: Secondary | ICD-10-CM | POA: Diagnosis not present

## 2020-12-10 DIAGNOSIS — Z0100 Encounter for examination of eyes and vision without abnormal findings: Secondary | ICD-10-CM | POA: Diagnosis not present

## 2020-12-31 DIAGNOSIS — M189 Osteoarthritis of first carpometacarpal joint, unspecified: Secondary | ICD-10-CM | POA: Diagnosis not present

## 2020-12-31 DIAGNOSIS — G5603 Carpal tunnel syndrome, bilateral upper limbs: Secondary | ICD-10-CM | POA: Diagnosis not present

## 2021-01-06 DIAGNOSIS — R202 Paresthesia of skin: Secondary | ICD-10-CM | POA: Diagnosis not present

## 2021-01-06 DIAGNOSIS — M25641 Stiffness of right hand, not elsewhere classified: Secondary | ICD-10-CM | POA: Diagnosis not present

## 2021-01-06 DIAGNOSIS — G5602 Carpal tunnel syndrome, left upper limb: Secondary | ICD-10-CM | POA: Diagnosis not present

## 2021-01-31 DIAGNOSIS — G5603 Carpal tunnel syndrome, bilateral upper limbs: Secondary | ICD-10-CM | POA: Diagnosis not present

## 2021-01-31 DIAGNOSIS — M189 Osteoarthritis of first carpometacarpal joint, unspecified: Secondary | ICD-10-CM | POA: Diagnosis not present

## 2021-02-17 DIAGNOSIS — Z0100 Encounter for examination of eyes and vision without abnormal findings: Secondary | ICD-10-CM | POA: Diagnosis not present

## 2021-03-08 DIAGNOSIS — M810 Age-related osteoporosis without current pathological fracture: Secondary | ICD-10-CM | POA: Diagnosis not present

## 2021-03-08 DIAGNOSIS — I10 Essential (primary) hypertension: Secondary | ICD-10-CM | POA: Diagnosis not present

## 2021-03-08 DIAGNOSIS — G43809 Other migraine, not intractable, without status migrainosus: Secondary | ICD-10-CM | POA: Diagnosis not present

## 2021-03-08 DIAGNOSIS — K219 Gastro-esophageal reflux disease without esophagitis: Secondary | ICD-10-CM | POA: Diagnosis not present

## 2021-03-08 DIAGNOSIS — R200 Anesthesia of skin: Secondary | ICD-10-CM | POA: Diagnosis not present

## 2021-03-08 DIAGNOSIS — Z23 Encounter for immunization: Secondary | ICD-10-CM | POA: Diagnosis not present

## 2021-04-11 DIAGNOSIS — R200 Anesthesia of skin: Secondary | ICD-10-CM | POA: Diagnosis not present

## 2021-04-11 DIAGNOSIS — R202 Paresthesia of skin: Secondary | ICD-10-CM | POA: Diagnosis not present

## 2021-04-11 DIAGNOSIS — M79642 Pain in left hand: Secondary | ICD-10-CM | POA: Diagnosis not present

## 2021-04-11 DIAGNOSIS — G5602 Carpal tunnel syndrome, left upper limb: Secondary | ICD-10-CM | POA: Diagnosis not present

## 2021-08-29 DIAGNOSIS — I10 Essential (primary) hypertension: Secondary | ICD-10-CM | POA: Diagnosis not present

## 2021-08-29 DIAGNOSIS — M810 Age-related osteoporosis without current pathological fracture: Secondary | ICD-10-CM | POA: Diagnosis not present

## 2021-08-29 DIAGNOSIS — R7309 Other abnormal glucose: Secondary | ICD-10-CM | POA: Diagnosis not present

## 2021-09-05 DIAGNOSIS — Z0000 Encounter for general adult medical examination without abnormal findings: Secondary | ICD-10-CM | POA: Diagnosis not present

## 2021-09-05 DIAGNOSIS — G5602 Carpal tunnel syndrome, left upper limb: Secondary | ICD-10-CM | POA: Diagnosis not present

## 2021-09-05 DIAGNOSIS — I10 Essential (primary) hypertension: Secondary | ICD-10-CM | POA: Diagnosis not present

## 2021-09-05 DIAGNOSIS — R7303 Prediabetes: Secondary | ICD-10-CM | POA: Diagnosis not present

## 2021-09-05 DIAGNOSIS — Z1389 Encounter for screening for other disorder: Secondary | ICD-10-CM | POA: Diagnosis not present

## 2021-09-21 DIAGNOSIS — G5602 Carpal tunnel syndrome, left upper limb: Secondary | ICD-10-CM | POA: Diagnosis not present

## 2021-09-21 DIAGNOSIS — R200 Anesthesia of skin: Secondary | ICD-10-CM | POA: Diagnosis not present

## 2021-09-21 DIAGNOSIS — M25532 Pain in left wrist: Secondary | ICD-10-CM | POA: Diagnosis not present

## 2021-09-21 DIAGNOSIS — R202 Paresthesia of skin: Secondary | ICD-10-CM | POA: Diagnosis not present

## 2021-10-20 DIAGNOSIS — G5602 Carpal tunnel syndrome, left upper limb: Secondary | ICD-10-CM | POA: Diagnosis not present

## 2021-11-09 DIAGNOSIS — G5602 Carpal tunnel syndrome, left upper limb: Secondary | ICD-10-CM | POA: Diagnosis not present

## 2021-11-21 ENCOUNTER — Other Ambulatory Visit: Payer: Self-pay | Admitting: Internal Medicine

## 2021-11-21 DIAGNOSIS — Z1231 Encounter for screening mammogram for malignant neoplasm of breast: Secondary | ICD-10-CM

## 2021-11-29 ENCOUNTER — Ambulatory Visit
Admission: RE | Admit: 2021-11-29 | Discharge: 2021-11-29 | Disposition: A | Discharge status: Home / Self Care | Payer: Medicare HMO | Source: Ambulatory Visit | Attending: Internal Medicine | Admitting: Internal Medicine

## 2021-11-29 DIAGNOSIS — Z1231 Encounter for screening mammogram for malignant neoplasm of breast: Secondary | ICD-10-CM | POA: Diagnosis not present

## 2022-01-05 DIAGNOSIS — Z9889 Other specified postprocedural states: Secondary | ICD-10-CM | POA: Diagnosis not present

## 2022-01-05 DIAGNOSIS — G5602 Carpal tunnel syndrome, left upper limb: Secondary | ICD-10-CM | POA: Diagnosis not present

## 2022-02-01 DIAGNOSIS — Z0100 Encounter for examination of eyes and vision without abnormal findings: Secondary | ICD-10-CM | POA: Diagnosis not present

## 2022-02-01 DIAGNOSIS — M3501 Sicca syndrome with keratoconjunctivitis: Secondary | ICD-10-CM | POA: Diagnosis not present

## 2022-03-02 DIAGNOSIS — R7303 Prediabetes: Secondary | ICD-10-CM | POA: Diagnosis not present

## 2022-03-02 DIAGNOSIS — I10 Essential (primary) hypertension: Secondary | ICD-10-CM | POA: Diagnosis not present

## 2022-03-03 DIAGNOSIS — M3501 Sicca syndrome with keratoconjunctivitis: Secondary | ICD-10-CM | POA: Diagnosis not present

## 2022-03-09 DIAGNOSIS — G43909 Migraine, unspecified, not intractable, without status migrainosus: Secondary | ICD-10-CM | POA: Diagnosis not present

## 2022-03-09 DIAGNOSIS — E7800 Pure hypercholesterolemia, unspecified: Secondary | ICD-10-CM | POA: Diagnosis not present

## 2022-03-09 DIAGNOSIS — R7303 Prediabetes: Secondary | ICD-10-CM | POA: Diagnosis not present

## 2022-03-09 DIAGNOSIS — I10 Essential (primary) hypertension: Secondary | ICD-10-CM | POA: Diagnosis not present

## 2022-09-07 DIAGNOSIS — R7303 Prediabetes: Secondary | ICD-10-CM | POA: Diagnosis not present

## 2022-09-07 DIAGNOSIS — I10 Essential (primary) hypertension: Secondary | ICD-10-CM | POA: Diagnosis not present

## 2022-09-07 DIAGNOSIS — E7800 Pure hypercholesterolemia, unspecified: Secondary | ICD-10-CM | POA: Diagnosis not present

## 2022-09-14 DIAGNOSIS — Z0000 Encounter for general adult medical examination without abnormal findings: Secondary | ICD-10-CM | POA: Diagnosis not present

## 2022-09-14 DIAGNOSIS — I10 Essential (primary) hypertension: Secondary | ICD-10-CM | POA: Diagnosis not present

## 2022-09-14 DIAGNOSIS — R7303 Prediabetes: Secondary | ICD-10-CM | POA: Diagnosis not present

## 2022-09-14 DIAGNOSIS — E7800 Pure hypercholesterolemia, unspecified: Secondary | ICD-10-CM | POA: Diagnosis not present

## 2022-09-14 DIAGNOSIS — Z1211 Encounter for screening for malignant neoplasm of colon: Secondary | ICD-10-CM | POA: Diagnosis not present

## 2022-10-10 DIAGNOSIS — M76891 Other specified enthesopathies of right lower limb, excluding foot: Secondary | ICD-10-CM | POA: Diagnosis not present

## 2022-10-10 DIAGNOSIS — M25551 Pain in right hip: Secondary | ICD-10-CM | POA: Diagnosis not present

## 2022-10-10 DIAGNOSIS — M25561 Pain in right knee: Secondary | ICD-10-CM | POA: Diagnosis not present

## 2022-10-17 DIAGNOSIS — M6281 Muscle weakness (generalized): Secondary | ICD-10-CM | POA: Diagnosis not present

## 2022-10-17 DIAGNOSIS — M76891 Other specified enthesopathies of right lower limb, excluding foot: Secondary | ICD-10-CM | POA: Diagnosis not present

## 2022-10-24 DIAGNOSIS — M76891 Other specified enthesopathies of right lower limb, excluding foot: Secondary | ICD-10-CM | POA: Diagnosis not present

## 2022-10-24 DIAGNOSIS — M6281 Muscle weakness (generalized): Secondary | ICD-10-CM | POA: Diagnosis not present

## 2022-10-26 DIAGNOSIS — M6281 Muscle weakness (generalized): Secondary | ICD-10-CM | POA: Diagnosis not present

## 2022-10-26 DIAGNOSIS — M76891 Other specified enthesopathies of right lower limb, excluding foot: Secondary | ICD-10-CM | POA: Diagnosis not present

## 2022-11-01 DIAGNOSIS — M76891 Other specified enthesopathies of right lower limb, excluding foot: Secondary | ICD-10-CM | POA: Diagnosis not present

## 2022-11-13 DIAGNOSIS — M76891 Other specified enthesopathies of right lower limb, excluding foot: Secondary | ICD-10-CM | POA: Diagnosis not present

## 2022-11-14 ENCOUNTER — Other Ambulatory Visit: Payer: Self-pay | Admitting: Orthopedic Surgery

## 2022-11-14 DIAGNOSIS — M76891 Other specified enthesopathies of right lower limb, excluding foot: Secondary | ICD-10-CM

## 2022-11-19 ENCOUNTER — Other Ambulatory Visit: Payer: HMO

## 2022-11-21 ENCOUNTER — Ambulatory Visit
Admission: RE | Admit: 2022-11-21 | Discharge: 2022-11-21 | Disposition: A | Discharge status: Home / Self Care | Payer: HMO | Source: Ambulatory Visit | Attending: Orthopedic Surgery | Admitting: Orthopedic Surgery

## 2022-11-21 DIAGNOSIS — M76891 Other specified enthesopathies of right lower limb, excluding foot: Secondary | ICD-10-CM | POA: Diagnosis not present

## 2022-11-21 DIAGNOSIS — S73191A Other sprain of right hip, initial encounter: Secondary | ICD-10-CM | POA: Diagnosis not present

## 2022-11-21 DIAGNOSIS — R600 Localized edema: Secondary | ICD-10-CM | POA: Diagnosis not present

## 2022-11-21 DIAGNOSIS — M7601 Gluteal tendinitis, right hip: Secondary | ICD-10-CM | POA: Diagnosis not present

## 2022-12-05 DIAGNOSIS — M7061 Trochanteric bursitis, right hip: Secondary | ICD-10-CM | POA: Diagnosis not present

## 2022-12-05 DIAGNOSIS — M1610 Unilateral primary osteoarthritis, unspecified hip: Secondary | ICD-10-CM | POA: Diagnosis not present

## 2022-12-14 DIAGNOSIS — R7303 Prediabetes: Secondary | ICD-10-CM | POA: Diagnosis not present

## 2022-12-14 DIAGNOSIS — E7800 Pure hypercholesterolemia, unspecified: Secondary | ICD-10-CM | POA: Diagnosis not present

## 2022-12-28 DIAGNOSIS — M25551 Pain in right hip: Secondary | ICD-10-CM | POA: Diagnosis not present

## 2023-01-05 DIAGNOSIS — M7061 Trochanteric bursitis, right hip: Secondary | ICD-10-CM | POA: Diagnosis not present

## 2023-01-15 ENCOUNTER — Other Ambulatory Visit: Payer: Self-pay | Admitting: Internal Medicine

## 2023-01-15 DIAGNOSIS — Z1231 Encounter for screening mammogram for malignant neoplasm of breast: Secondary | ICD-10-CM

## 2023-01-29 DIAGNOSIS — Z86010 Personal history of colonic polyps: Secondary | ICD-10-CM | POA: Diagnosis not present

## 2023-01-29 DIAGNOSIS — Z09 Encounter for follow-up examination after completed treatment for conditions other than malignant neoplasm: Secondary | ICD-10-CM | POA: Diagnosis not present

## 2023-02-05 ENCOUNTER — Ambulatory Visit
Admission: RE | Admit: 2023-02-05 | Discharge: 2023-02-05 | Disposition: A | Discharge status: Home / Self Care | Payer: HMO | Source: Ambulatory Visit | Attending: Internal Medicine | Admitting: Internal Medicine

## 2023-02-05 DIAGNOSIS — Z1231 Encounter for screening mammogram for malignant neoplasm of breast: Secondary | ICD-10-CM | POA: Diagnosis not present

## 2023-02-07 DIAGNOSIS — H26492 Other secondary cataract, left eye: Secondary | ICD-10-CM | POA: Diagnosis not present

## 2023-02-07 DIAGNOSIS — H04123 Dry eye syndrome of bilateral lacrimal glands: Secondary | ICD-10-CM | POA: Diagnosis not present

## 2023-02-07 DIAGNOSIS — Z961 Presence of intraocular lens: Secondary | ICD-10-CM | POA: Diagnosis not present

## 2023-02-07 DIAGNOSIS — H26491 Other secondary cataract, right eye: Secondary | ICD-10-CM | POA: Diagnosis not present

## 2023-02-27 DIAGNOSIS — H26491 Other secondary cataract, right eye: Secondary | ICD-10-CM | POA: Diagnosis not present

## 2023-03-13 DIAGNOSIS — Z961 Presence of intraocular lens: Secondary | ICD-10-CM | POA: Diagnosis not present

## 2023-03-13 DIAGNOSIS — Z9889 Other specified postprocedural states: Secondary | ICD-10-CM | POA: Diagnosis not present

## 2023-03-20 DIAGNOSIS — H26492 Other secondary cataract, left eye: Secondary | ICD-10-CM | POA: Diagnosis not present

## 2023-04-10 DIAGNOSIS — H04123 Dry eye syndrome of bilateral lacrimal glands: Secondary | ICD-10-CM | POA: Diagnosis not present

## 2023-04-10 DIAGNOSIS — Z9889 Other specified postprocedural states: Secondary | ICD-10-CM | POA: Diagnosis not present

## 2023-05-15 DIAGNOSIS — H04123 Dry eye syndrome of bilateral lacrimal glands: Secondary | ICD-10-CM | POA: Diagnosis not present

## 2023-06-07 DIAGNOSIS — M7061 Trochanteric bursitis, right hip: Secondary | ICD-10-CM | POA: Diagnosis not present

## 2023-06-15 ENCOUNTER — Other Ambulatory Visit: Payer: Self-pay | Admitting: Orthopedic Surgery

## 2023-06-18 ENCOUNTER — Ambulatory Visit
Admission: RE | Admit: 2023-06-18 | Discharge: 2023-06-18 | Disposition: A | Discharge status: Home / Self Care | Payer: PPO | Attending: Orthopedic Surgery | Admitting: Orthopedic Surgery

## 2023-06-18 ENCOUNTER — Other Ambulatory Visit: Payer: Self-pay

## 2023-06-18 ENCOUNTER — Encounter
Admission: RE | Disposition: A | Discharge status: Home / Self Care | Payer: Self-pay | Source: Home / Self Care | Attending: Orthopedic Surgery

## 2023-06-18 ENCOUNTER — Ambulatory Visit: Payer: PPO | Admitting: Anesthesiology

## 2023-06-18 ENCOUNTER — Encounter: Payer: Self-pay | Admitting: Orthopedic Surgery

## 2023-06-18 ENCOUNTER — Ambulatory Visit: Payer: PPO

## 2023-06-18 DIAGNOSIS — Z8673 Personal history of transient ischemic attack (TIA), and cerebral infarction without residual deficits: Secondary | ICD-10-CM | POA: Insufficient documentation

## 2023-06-18 DIAGNOSIS — I10 Essential (primary) hypertension: Secondary | ICD-10-CM | POA: Insufficient documentation

## 2023-06-18 DIAGNOSIS — M7061 Trochanteric bursitis, right hip: Secondary | ICD-10-CM | POA: Diagnosis not present

## 2023-06-18 DIAGNOSIS — M7631 Iliotibial band syndrome, right leg: Secondary | ICD-10-CM | POA: Diagnosis not present

## 2023-06-18 DIAGNOSIS — M1990 Unspecified osteoarthritis, unspecified site: Secondary | ICD-10-CM | POA: Insufficient documentation

## 2023-06-18 DIAGNOSIS — Z136 Encounter for screening for cardiovascular disorders: Secondary | ICD-10-CM | POA: Diagnosis not present

## 2023-06-18 DIAGNOSIS — Z9889 Other specified postprocedural states: Secondary | ICD-10-CM | POA: Diagnosis not present

## 2023-06-18 LAB — CBC
HCT: 35.4 % — ABNORMAL LOW (ref 36.0–46.0)
Hemoglobin: 12.2 g/dL (ref 12.0–15.0)
MCH: 30.9 pg (ref 26.0–34.0)
MCHC: 34.5 g/dL (ref 30.0–36.0)
MCV: 89.6 fL (ref 80.0–100.0)
Platelets: 318 10*3/uL (ref 150–400)
RBC: 3.95 MIL/uL (ref 3.87–5.11)
RDW: 13.3 % (ref 11.5–15.5)
WBC: 7.5 10*3/uL (ref 4.0–10.5)
nRBC: 0 % (ref 0.0–0.2)

## 2023-06-18 LAB — BASIC METABOLIC PANEL
Anion gap: 10 (ref 5–15)
BUN: 22 mg/dL (ref 8–23)
CO2: 25 mmol/L (ref 22–32)
Calcium: 9.1 mg/dL (ref 8.9–10.3)
Chloride: 105 mmol/L (ref 98–111)
Creatinine, Ser: 0.78 mg/dL (ref 0.44–1.00)
GFR, Estimated: >60 mL/min (ref >60)
Glucose, Bld: 92 mg/dL (ref 70–99)
Potassium: 3.3 mmol/L — ABNORMAL LOW (ref 3.5–5.1)
Sodium: 140 mmol/L (ref 135–145)

## 2023-06-18 SURGERY — ARTHROSCOPIC TROCHANTERIC BURSECTOMY
Anesthesia: General | Site: Hip | Laterality: Right

## 2023-06-18 MED ORDER — ORAL CARE MOUTH RINSE: 15.0000 mL | Freq: Once | OROMUCOSAL | Status: AC

## 2023-06-18 MED ORDER — MIDAZOLAM HCL 2 MG/2ML IJ SOLN
INTRAMUSCULAR | Status: AC
  Filled 2023-06-18: qty 2

## 2023-06-18 MED ORDER — CHLORHEXIDINE GLUCONATE 0.12 % MT SOLN
OROMUCOSAL | Status: AC
  Filled 2023-06-18: qty 15

## 2023-06-18 MED ORDER — EPHEDRINE SULFATE-NACL 50-0.9 MG/10ML-% IV SOSY
PREFILLED_SYRINGE | INTRAVENOUS | Status: DC | PRN
  Administered 2023-06-18: 5 mg via INTRAVENOUS

## 2023-06-18 MED ORDER — GLYCOPYRROLATE 0.2 MG/ML IJ SOLN
INTRAMUSCULAR | Status: AC
  Filled 2023-06-18: qty 1

## 2023-06-18 MED ORDER — FENTANYL CITRATE (PF) 100 MCG/2ML IJ SOLN
INTRAMUSCULAR | Status: AC
Start: 2023-06-18 — End: ?
  Filled 2023-06-18: qty 2

## 2023-06-18 MED ORDER — PROPOFOL 10 MG/ML IV BOLUS
INTRAVENOUS | Status: DC | PRN
  Administered 2023-06-18: 80 mg via INTRAVENOUS

## 2023-06-18 MED ORDER — CEFAZOLIN SODIUM-DEXTROSE 2-4 GM/100ML-% IV SOLN
2.0000 g | INTRAVENOUS | Status: AC
Start: 2023-06-18 — End: 2023-06-18
  Administered 2023-06-18: 2 g via INTRAVENOUS

## 2023-06-18 MED ORDER — FENTANYL CITRATE (PF) 100 MCG/2ML IJ SOLN: 25.0000 ug | INTRAMUSCULAR | Status: DC | PRN

## 2023-06-18 MED ORDER — DEXAMETHASONE SODIUM PHOSPHATE 10 MG/ML IJ SOLN
INTRAMUSCULAR | Status: DC | PRN
  Administered 2023-06-18: 10 mg via INTRAVENOUS

## 2023-06-18 MED ORDER — CEFAZOLIN SODIUM-DEXTROSE 2-4 GM/100ML-% IV SOLN
INTRAVENOUS | Status: AC
  Filled 2023-06-18: qty 100

## 2023-06-18 MED ORDER — HYDROCODONE-ACETAMINOPHEN 10-325 MG PO TABS: 1.0000 | ORAL_TABLET | ORAL | 0 refills | Status: AC | PRN

## 2023-06-18 MED ORDER — ACETAMINOPHEN 500 MG PO TABS: 1000.0000 mg | ORAL_TABLET | Freq: Three times a day (TID) | ORAL | 2 refills | Status: AC

## 2023-06-18 MED ORDER — ACETAMINOPHEN 10 MG/ML IV SOLN
INTRAVENOUS | Status: AC
  Filled 2023-06-18: qty 100

## 2023-06-18 MED ORDER — HYDROMORPHONE HCL 1 MG/ML IJ SOLN
INTRAMUSCULAR | Status: AC
  Filled 2023-06-18: qty 1

## 2023-06-18 MED ORDER — BUPIVACAINE HCL (PF) 0.5 % IJ SOLN
INTRAMUSCULAR | Status: DC | PRN
  Administered 2023-06-18: 13.5 mL

## 2023-06-18 MED ORDER — ASPIRIN 325 MG PO TBEC
325.0000 mg | DELAYED_RELEASE_TABLET | Freq: Every day | ORAL | 0 refills | Status: AC
Start: 2023-06-18 — End: 2023-07-02

## 2023-06-18 MED ORDER — ONDANSETRON HCL 4 MG/2ML IJ SOLN
INTRAMUSCULAR | Status: AC
  Filled 2023-06-18: qty 2

## 2023-06-18 MED ORDER — ONDANSETRON HCL 4 MG/2ML IJ SOLN
INTRAMUSCULAR | Status: DC | PRN
  Administered 2023-06-18: 4 mg via INTRAVENOUS

## 2023-06-18 MED ORDER — LACTATED RINGERS IR SOLN
Status: DC | PRN
  Administered 2023-06-18 (×3): 6000 mL
  Administered 2023-06-18: 3000 mL
  Administered 2023-06-18: 6000 mL

## 2023-06-18 MED ORDER — CHLORHEXIDINE GLUCONATE 0.12 % MT SOLN
15.0000 mL | Freq: Once | OROMUCOSAL | Status: AC
  Administered 2023-06-18: 15 mL via OROMUCOSAL

## 2023-06-18 MED ORDER — LIDOCAINE-EPINEPHRINE 1 %-1:100000 IJ SOLN
INTRAMUSCULAR | Status: AC
  Filled 2023-06-18: qty 1

## 2023-06-18 MED ORDER — FENTANYL CITRATE (PF) 100 MCG/2ML IJ SOLN
INTRAMUSCULAR | Status: AC
  Filled 2023-06-18: qty 2

## 2023-06-18 MED ORDER — FENTANYL CITRATE (PF) 100 MCG/2ML IJ SOLN
INTRAMUSCULAR | Status: DC | PRN
  Administered 2023-06-18: 25 ug via INTRAVENOUS
  Administered 2023-06-18 (×3): 50 ug via INTRAVENOUS
  Administered 2023-06-18: 25 ug via INTRAVENOUS

## 2023-06-18 MED ORDER — DEXAMETHASONE SODIUM PHOSPHATE 10 MG/ML IJ SOLN
INTRAMUSCULAR | Status: AC
  Filled 2023-06-18: qty 1

## 2023-06-18 MED ORDER — LIDOCAINE HCL (CARDIAC) PF 100 MG/5ML IV SOSY
PREFILLED_SYRINGE | INTRAVENOUS | Status: DC | PRN
  Administered 2023-06-18: 100 mg via INTRAVENOUS

## 2023-06-18 MED ORDER — LIDOCAINE-EPINEPHRINE 1 %-1:100000 IJ SOLN
INTRAMUSCULAR | Status: DC | PRN
  Administered 2023-06-18: 13.5 mL

## 2023-06-18 MED ORDER — BUPIVACAINE HCL (PF) 0.5 % IJ SOLN
INTRAMUSCULAR | Status: AC
  Filled 2023-06-18: qty 30

## 2023-06-18 MED ORDER — GLYCOPYRROLATE 0.2 MG/ML IJ SOLN
INTRAMUSCULAR | Status: DC | PRN
  Administered 2023-06-18: .2 mg via INTRAVENOUS

## 2023-06-18 MED ORDER — PROPOFOL 10 MG/ML IV BOLUS
INTRAVENOUS | Status: AC
  Filled 2023-06-18: qty 20

## 2023-06-18 MED ORDER — DROPERIDOL 2.5 MG/ML IJ SOLN: 0.6250 mg | Freq: Once | INTRAMUSCULAR | Status: DC | PRN

## 2023-06-18 MED ORDER — ONDANSETRON 4 MG PO TBDP: 4.0000 mg | ORAL_TABLET | Freq: Three times a day (TID) | ORAL | 0 refills | Status: AC | PRN

## 2023-06-18 MED ORDER — HYDROMORPHONE HCL 1 MG/ML IJ SOLN
INTRAMUSCULAR | Status: DC | PRN
  Administered 2023-06-18 (×2): .5 mg via INTRAVENOUS

## 2023-06-18 MED ORDER — MIDAZOLAM HCL 2 MG/2ML IJ SOLN
INTRAMUSCULAR | Status: DC | PRN
  Administered 2023-06-18: 1 mg via INTRAVENOUS

## 2023-06-18 MED ORDER — ACETAMINOPHEN 10 MG/ML IV SOLN
INTRAVENOUS | Status: DC | PRN
  Administered 2023-06-18: 1000 mg via INTRAVENOUS

## 2023-06-18 MED ORDER — LIDOCAINE HCL (PF) 2 % IJ SOLN
INTRAMUSCULAR | Status: AC
  Filled 2023-06-18: qty 5

## 2023-06-18 MED ORDER — LACTATED RINGERS IV SOLN: INTRAVENOUS | Status: DC

## 2023-06-18 SURGICAL SUPPLY — 40 items
ADAPTER IRRIG TUBE 2 SPIKE SOL (ADAPTER) ×2 IMPLANT
BLADE FULL RADIUS 3.5 (BLADE) IMPLANT
BLADE SHAVER 4.5X7 STR FR (MISCELLANEOUS) IMPLANT
BNDG ADH 1X3 SHEER STRL LF (GAUZE/BANDAGES/DRESSINGS) ×6 IMPLANT
BUR BR 5.5 WIDE MOUTH (BURR) IMPLANT
CANNULA TWIST IN 8.25X7CM (CANNULA) ×1 IMPLANT
CHLORAPREP W/TINT 26 (MISCELLANEOUS) ×1 IMPLANT
DRAPE C-ARM XRAY 36X54 (DRAPES) ×1 IMPLANT
DRAPE U-SHAPE 47X51 STRL (DRAPES) ×2 IMPLANT
GAUZE SPONGE 4X4 12PLY STRL (GAUZE/BANDAGES/DRESSINGS) ×1 IMPLANT
GAUZE XEROFORM 1X8 LF (GAUZE/BANDAGES/DRESSINGS) ×1 IMPLANT
GLOVE BIO SURGEON STRL SZ7.5 (GLOVE) ×2 IMPLANT
GLOVE BIO SURGEON STRL SZ8 (GLOVE) ×2 IMPLANT
GLOVE BIOGEL PI IND STRL 8 (GLOVE) ×1 IMPLANT
GLOVE INDICATOR 8.0 STRL GRN (GLOVE) ×1 IMPLANT
GLOVE SURG ORTHO 8.0 STRL STRW (GLOVE) ×1 IMPLANT
GLOVE SURG SYN 7.5 E (GLOVE) ×1 IMPLANT
GLOVE SURG SYN 7.5 PF PI (GLOVE) ×1 IMPLANT
GOWN STRL REUS W/ TWL LRG LVL3 (GOWN DISPOSABLE) ×1 IMPLANT
GOWN STRL REUS W/ TWL XL LVL3 (GOWN DISPOSABLE) ×1 IMPLANT
IV NS IRRIG 3000ML ARTHROMATIC (IV SOLUTION) ×4 IMPLANT
KIT TURNOVER KIT A (KITS) ×1 IMPLANT
MANIFOLD NEPTUNE II (INSTRUMENTS) ×1 IMPLANT
MAT ABSORB FLUID 56X50 GRAY (MISCELLANEOUS) ×1 IMPLANT
NDL HYPO 22X1.5 SAFETY MO (MISCELLANEOUS) ×1 IMPLANT
NEEDLE HYPO 22X1.5 SAFETY MO (MISCELLANEOUS) ×1 IMPLANT
PACK ARTHROSCOPY SHOULDER (MISCELLANEOUS) ×1 IMPLANT
PAD ABD DERMACEA PRESS 5X9 (GAUZE/BANDAGES/DRESSINGS) ×1 IMPLANT
SPONGE T-LAP 18X18 ~~LOC~~+RFID (SPONGE) ×1 IMPLANT
SUT ETHILON 3-0 FS-10 30 BLK (SUTURE) ×1 IMPLANT
SUT VIC AB 2-0 CT2 27 (SUTURE) ×1 IMPLANT
SUTURE EHLN 3-0 FS-10 30 BLK (SUTURE) ×1 IMPLANT
SYR 10ML LL (SYRINGE) ×1 IMPLANT
SYR 20ML LL LF (SYRINGE) ×1 IMPLANT
TRAP FLUID SMOKE EVACUATOR (MISCELLANEOUS) ×1 IMPLANT
TUBE SET DOUBLEFLO INFLOW (TUBING) ×1 IMPLANT
TUBE SET DOUBLEFLO OUTFLOW (TUBING) ×1 IMPLANT
TUBING CONNECTING 10 (TUBING) IMPLANT
WAND 4.6 50D HIPVAC 50 IFS (SURGICAL WAND) ×1 IMPLANT
WATER STERILE IRR 500ML POUR (IV SOLUTION) ×1 IMPLANT

## 2023-06-18 NOTE — Anesthesia Procedure Notes (Signed)
 Procedure Name: LMA Insertion Date/Time: 06/18/2023 12:35 PM  Performed by: Rich Brave, CRNAPre-anesthesia Checklist: Patient identified, Emergency Drugs available, Timeout performed, Patient being monitored and Suction available Patient Re-evaluated:Patient Re-evaluated prior to induction Oxygen Delivery Method: Circle system utilized Preoxygenation: Pre-oxygenation with 100% oxygen Ventilation: Mask ventilation without difficulty LMA: LMA inserted LMA Size: 4.0 Placement Confirmation: positive ETCO2 and breath sounds checked- equal and bilateral Tube secured with: Tape Dental Injury: Teeth and Oropharynx as per pre-operative assessment

## 2023-06-18 NOTE — H&P (Signed)
 Paper H&P to be scanned into permanent record. H&P reviewed. No significant changes noted.

## 2023-06-18 NOTE — Anesthesia Preprocedure Evaluation (Signed)
 Anesthesia Evaluation  Patient identified by MRN, date of birth, ID band Patient awake    Reviewed: Allergy & Precautions, H&P , NPO status , Patient's Chart, lab work & pertinent test results, reviewed documented beta blocker date and time   History of Anesthesia Complications Negative for: history of anesthetic complications  Airway Mallampati: II  TM Distance: >3 FB Neck ROM: full    Dental  (+) Teeth Intact, Dental Advidsory Given, Caps   Pulmonary neg pulmonary ROS   Pulmonary exam normal breath sounds clear to auscultation       Cardiovascular Exercise Tolerance: Good hypertension, (-) angina (-) Past MI and (-) Cardiac Stents Normal cardiovascular exam(-) dysrhythmias (-) Valvular Problems/Murmurs Rhythm:regular Rate:Normal     Neuro/Psych  Headaches, neg Seizures Periodic double vision but no upper or lower extr loss of function described CVA, Residual Symptoms negative neurological ROS  negative psych ROS   GI/Hepatic negative GI ROS, Neg liver ROS,GERD  ,,  Endo/Other  negative endocrine ROS    Renal/GU negative Renal ROS  negative genitourinary   Musculoskeletal  (+) Arthritis ,    Abdominal   Peds  Hematology negative hematology ROS (+)   Anesthesia Other Findings Past Medical History: No date: Arthritis No date: Cancer (HCC)     Comment:  BASAL CELL  No date: GERD (gastroesophageal reflux disease) No date: Headache     Comment:  MIGRAINES No date: Hypertension No date: Osteoporosis 1994: Stroke (HCC)     Comment:  HEMORRHAGIC STROKE-HAD GAMMA KNIFE BRAIN PROCEDURE-PT               DOES HAVE DOUBLE VISION THAT HAPPENS EVERY 15 MINUTES AND              RESOLVES WITHIN 3 MINUTES SINCE HER STROKE IN 1994 No date: Vitamin D deficiency  Reproductive/Obstetrics negative OB ROS                             Anesthesia Physical Anesthesia Plan  ASA: 2  Anesthesia Plan:  General   Post-op Pain Management:    Induction: Intravenous  PONV Risk Score and Plan: 3 and Ondansetron, Dexamethasone and Treatment may vary due to age or medical condition  Airway Management Planned: Oral ETT  Additional Equipment:   Intra-op Plan:   Post-operative Plan: Extubation in OR  Informed Consent: I have reviewed the patients History and Physical, chart, labs and discussed the procedure including the risks, benefits and alternatives for the proposed anesthesia with the patient or authorized representative who has indicated his/her understanding and acceptance.     Dental Advisory Given  Plan Discussed with: CRNA  Anesthesia Plan Comments:         Anesthesia Quick Evaluation

## 2023-06-18 NOTE — Op Note (Signed)
 DATE OF SURGERY:  06/18/2023   PREOPERATIVE DIAGNOSIS:  1. Right hip trochanteric bursitis 2. Right hip iliotibial band tightness   POSTOPERATIVE DIAGNOSIS:  1. Right hip trochanteric bursitis 2. Right hip iliotibial band tightness   PROCEDURE:  1. Right hip endoscopic trochanteric bursectomy 2. Right hip endoscopic iliotibial band release  SURGEON: Rosealee Albee, MD   EBL: 5cc  ANESTHESIA: Gen  IMPLANTS: none  INDICATION(S): The patient is a 78 y.o. female who presents with persistent lateral sided hip pain. The MRI revealed significant trochanteric bursitis without significant hip abductor tearing.  The patient has failed all non-operative care to date including corticosteroid injections, physical therapy/exercises, medications, and activity modification.  Please see the preoperative notes for further detail.   The patient elected to undergo the above mentioned procedure after detailed explanation of the expected outcomes and recovery path and after discussion of risks, benefits, and alternatives to surgery  OPERATIVE FINDINGS:  Significant trochanteric bursitis. Intact gluteus medius/minimus tendon insertion.  OPERATIVE REPORT:   The LIZBET CIRRINCIONE was brought to the operating room and underwent anesthesia. The patient was placed in a supine fashion on the Hana table.  The operative extremity was flexed approximately 10 degrees and abducted approximately 30 degrees.  The foot was internally rotated. All bony prominences were padded.  Appropriate IV antibiotics were administered.  The patient was prepped and draped in a sterile fashion.  Time-out was performed and landmarks were identified with fluoroscopic assistance.  Needle localization with fluoroscopy was used to make a standard anterolateral portal at the level of the tip of the greater trochanter.  The blunt trocar was inserted deep to the IT band and along the greater trochanter.  The peritrochanteric space was opened with a  blunt trocar.  Appropriate positioning was confirmed with fluoroscopy.  A 70 degree knee arthroscope was used for this procedure, and it was inserted.  Next a distal anterolateral portal was established approximately 6cm distal to the anterior portal just anterior to the IT band and greater trochanter. This was also done under needle localization to ensure appropriate trajectory.  A switching stick was inserted and appropriate positioning was confirmed with fluoroscopy.  A cannula was placed over the switching stick.  A shaver was introduced.  Using combination of oscillating shaver and electrocautery wand, the significant amount of trochanteric bursa was excised.  After excision and debridement of the bursa, the gluteal sling, vastus lateralis, and gluteus medius/minimus insertion were well visualized.  There was no significant gluteus medius/minimus tear upon probing the insertion.  The leg was then placed in an adducted position.  The most prominent portion of the greater trochanter was adjacent to the IT band.  The IT band was then released via a cruciate type incision using an ArthroCare wand to reduce friction and irritation in this region of prominence.  Arthroscopic fluid was then evacuated from the joint.    Local anesthetic was injected.  Portal sites were closed with 2-0 Vicryl and 3-0 nylon sutures.  Sterile dressing was applied.  The patient was awakened from anesthesia without difficulty and transferred to PACU in stable condition.    POSTOPERATIVE PLAN:  FFWB x 1 week, WBAT with walker/crutches for 2nd week.  Can wean from assistive device afterwards.  ASA 325 mg daily x2 weeks for DVT prophylaxis.  Follow-up with in approximately 2 weeks for postoperative visit.

## 2023-06-18 NOTE — Discharge Instructions (Addendum)
 Endoscopic Trochanteric Bursectomy   Post-Op Instructions   1. Bracing or crutches: Crutches will be provided at the time of discharge from the surgery center if you do not already have them. You can use a walker if you prefer.    2. Ice: You may be provided with a device [facility]) that allows you to ice the affected area effectively. Otherwise you can ice manually.    3. Driving:  Plan on not driving for at least one week. Please note that you are advised NOT to drive while taking narcotic pain medications as you may be impaired and unsafe to drive.   4. Activity: Ankle pumps several times an hour while awake to prevent blood clots. Weight bearing: as tolerated. Use crutches/walker as needed (usually 1-2 weeks) until pain allows you to ambulate without a limp. Avoid standing more than 5 minutes (consecutively) for the first week.  Avoid long distance travel for 2 weeks.   5. Medications:  - You have been provided a prescription for narcotic pain medicine. After surgery, take 1-2 narcotic tablets every 4 hours if needed for severe pain.  - You may take up to 3000mg /day of tylenol (acetaminophen). You can take 1000mg  3x/day. Please check your narcotic. If you have acetaminophen in your narcotic (each tablet will be 325mg ), be careful not to exceed a total of 3000mg /day of acetaminophen.  - A prescription for anti-nausea medication will be provided in case the narcotic medicine causes nausea - take 1 tablet every 6 hours only if nauseated.  - Take enteric coated aspirin 325 mg once daily for 2 weeks to prevent blood clots.   6. Bandages: You may remove the bandages after 5 days. Drainage from the bandages (clear/reddish) can frequently occur. If this does occur, you may remove the dressing and apply another sterile dressing. You can shower after bandages are removed.    7. Physical Therapy: not needed   8. Work: May return to full work usually around 2 weeks after 1st post-operative visit. May  do light duty/desk job in approximately 1-2 weeks when off of narcotics, pain is well-controlled, and swelling has decreased. Labor intensive jobs may require 4-6 weeks to return.      9. Post-Op Appointments: Your first post-op appointment will be with Dr. Allena Katz in approximately 2 weeks time.    If you find that they have not been scheduled please call the Orthopaedic Appointment front desk at [phone].

## 2023-06-18 NOTE — Transfer of Care (Signed)
 Immediate Anesthesia Transfer of Care Note  Patient: Natalie Cooper  Procedure(s) Performed: Right hip endoscopic trochanteric bursectomy and IT band release (Right: Hip)  Patient Location: PACU  Anesthesia Type:General  Level of Consciousness: awake, alert , oriented, and patient cooperative  Airway & Oxygen Therapy: Patient Spontanous Breathing and Patient connected to face mask oxygen  Post-op Assessment: Report given to RN and Post -op Vital signs reviewed and stable  Post vital signs: Reviewed and stable  Last Vitals:  Vitals Value Taken Time  BP 121/64 06/18/23 1441  Temp 97.0   Pulse 70 06/18/23 1445  Resp 10 06/18/23 1445  SpO2 100 % 06/18/23 1445  Vitals shown include unfiled device data.  Last Pain:  Vitals:   06/18/23 1125  TempSrc: Temporal  PainSc: 0-No pain         Complications: No notable events documented.

## 2023-06-19 NOTE — Anesthesia Postprocedure Evaluation (Signed)
 Anesthesia Post Note  Patient: Natalie Cooper  Procedure(s) Performed: Right hip endoscopic trochanteric bursectomy and IT band release (Right: Hip)  Patient location during evaluation: PACU Anesthesia Type: General Level of consciousness: awake and alert Pain management: pain level controlled Vital Signs Assessment: post-procedure vital signs reviewed and stable Respiratory status: spontaneous breathing, nonlabored ventilation, respiratory function stable and patient connected to nasal cannula oxygen Cardiovascular status: blood pressure returned to baseline and stable Postop Assessment: no apparent nausea or vomiting Anesthetic complications: no   No notable events documented.   Last Vitals:  Vitals:   06/18/23 1530 06/18/23 1543  BP: (!) 145/83 (!) 167/84  Pulse: 77 66  Resp: 19 18  Temp:  (!) 36.2 C  SpO2: 99% 98%    Last Pain:  Vitals:   06/18/23 1543  TempSrc: Temporal  PainSc: 0-No pain                 Louie Boston

## 2023-07-06 DIAGNOSIS — E876 Hypokalemia: Secondary | ICD-10-CM | POA: Diagnosis not present

## 2023-08-24 ENCOUNTER — Encounter: Payer: Self-pay | Admitting: Gastroenterology

## 2023-09-03 ENCOUNTER — Encounter: Payer: Self-pay | Admitting: Gastroenterology

## 2023-09-03 ENCOUNTER — Ambulatory Visit: Admitting: Anesthesiology

## 2023-09-03 ENCOUNTER — Ambulatory Visit
Admission: RE | Admit: 2023-09-03 | Discharge: 2023-09-03 | Disposition: A | Discharge status: Home / Self Care | Payer: PPO | Attending: Gastroenterology | Admitting: Gastroenterology

## 2023-09-03 ENCOUNTER — Encounter
Admission: RE | Disposition: A | Discharge status: Home / Self Care | Payer: Self-pay | Source: Home / Self Care | Attending: Gastroenterology

## 2023-09-03 DIAGNOSIS — I10 Essential (primary) hypertension: Secondary | ICD-10-CM | POA: Insufficient documentation

## 2023-09-03 DIAGNOSIS — K219 Gastro-esophageal reflux disease without esophagitis: Secondary | ICD-10-CM | POA: Insufficient documentation

## 2023-09-03 DIAGNOSIS — Z09 Encounter for follow-up examination after completed treatment for conditions other than malignant neoplasm: Secondary | ICD-10-CM | POA: Diagnosis not present

## 2023-09-03 DIAGNOSIS — K640 First degree hemorrhoids: Secondary | ICD-10-CM | POA: Diagnosis not present

## 2023-09-03 DIAGNOSIS — Z90710 Acquired absence of both cervix and uterus: Secondary | ICD-10-CM | POA: Insufficient documentation

## 2023-09-03 DIAGNOSIS — M1990 Unspecified osteoarthritis, unspecified site: Secondary | ICD-10-CM | POA: Insufficient documentation

## 2023-09-03 DIAGNOSIS — Z860100 Personal history of colon polyps, unspecified: Secondary | ICD-10-CM | POA: Diagnosis present

## 2023-09-03 DIAGNOSIS — Z1211 Encounter for screening for malignant neoplasm of colon: Secondary | ICD-10-CM | POA: Diagnosis not present

## 2023-09-03 DIAGNOSIS — Z8673 Personal history of transient ischemic attack (TIA), and cerebral infarction without residual deficits: Secondary | ICD-10-CM | POA: Insufficient documentation

## 2023-09-03 DIAGNOSIS — K5730 Diverticulosis of large intestine without perforation or abscess without bleeding: Secondary | ICD-10-CM | POA: Insufficient documentation

## 2023-09-03 DIAGNOSIS — K649 Unspecified hemorrhoids: Secondary | ICD-10-CM | POA: Diagnosis not present

## 2023-09-03 DIAGNOSIS — R7303 Prediabetes: Secondary | ICD-10-CM | POA: Insufficient documentation

## 2023-09-03 DIAGNOSIS — Z860101 Personal history of adenomatous and serrated colon polyps: Secondary | ICD-10-CM | POA: Diagnosis not present

## 2023-09-03 HISTORY — DX: Pure hypercholesterolemia, unspecified: E78.00

## 2023-09-03 HISTORY — PX: COLONOSCOPY WITH PROPOFOL: SHX5780

## 2023-09-03 HISTORY — DX: Unspecified nystagmus: H55.00

## 2023-09-03 HISTORY — DX: Prediabetes: R73.03

## 2023-09-03 HISTORY — DX: Migraine, unspecified, not intractable, without status migrainosus: G43.909

## 2023-09-03 SURGERY — COLONOSCOPY WITH PROPOFOL
Anesthesia: General

## 2023-09-03 MED ORDER — PROPOFOL 500 MG/50ML IV EMUL
INTRAVENOUS | Status: DC | PRN
  Administered 2023-09-03: 75 ug/kg/min via INTRAVENOUS

## 2023-09-03 MED ORDER — LIDOCAINE HCL (CARDIAC) PF 100 MG/5ML IV SOSY
PREFILLED_SYRINGE | INTRAVENOUS | Status: DC | PRN
  Administered 2023-09-03: 60 mg via INTRAVENOUS

## 2023-09-03 MED ORDER — SODIUM CHLORIDE 0.9 % IV SOLN: INTRAVENOUS | Status: DC

## 2023-09-03 MED ORDER — DEXMEDETOMIDINE HCL IN NACL 80 MCG/20ML IV SOLN
INTRAVENOUS | Status: DC | PRN
Start: 2023-09-03 — End: 2023-09-03
  Administered 2023-09-03: 12 ug via INTRAVENOUS

## 2023-09-03 MED ORDER — EPHEDRINE SULFATE-NACL 50-0.9 MG/10ML-% IV SOSY
PREFILLED_SYRINGE | INTRAVENOUS | Status: DC | PRN
  Administered 2023-09-03: 10 mg via INTRAVENOUS

## 2023-09-03 MED ORDER — PROPOFOL 10 MG/ML IV BOLUS
INTRAVENOUS | Status: DC | PRN
Start: 2023-09-03 — End: 2023-09-03
  Administered 2023-09-03: 40 mg via INTRAVENOUS

## 2023-09-03 NOTE — Transfer of Care (Signed)
 Immediate Anesthesia Transfer of Care Note  Patient: Natalie Cooper  Procedure(s) Performed: COLONOSCOPY WITH PROPOFOL   Patient Location: PACU  Anesthesia Type:General  Level of Consciousness: sedated  Airway & Oxygen Therapy: Patient Spontanous Breathing  Post-op Assessment: Report given to RN and Post -op Vital signs reviewed and stable  Post vital signs: Reviewed and stable  Last Vitals:  Vitals Value Taken Time  BP    Temp    Pulse    Resp    SpO2      Last Pain:  Vitals:   09/03/23 1150  TempSrc: Temporal         Complications: No notable events documented.

## 2023-09-03 NOTE — Anesthesia Preprocedure Evaluation (Addendum)
 Anesthesia Evaluation  Patient identified by MRN, date of birth, ID band Patient awake    Reviewed: Allergy & Precautions, NPO status , Patient's Chart, lab work & pertinent test results  History of Anesthesia Complications Negative for: history of anesthetic complications  Airway Mallampati: IV   Neck ROM: Full    Dental no notable dental hx.    Pulmonary neg pulmonary ROS   Pulmonary exam normal breath sounds clear to auscultation       Cardiovascular hypertension, Normal cardiovascular exam Rhythm:Regular Rate:Normal  ECG 06/18/23:  Sinus bradycardia with occasional Premature ventricular complexes Otherwise normal ECG   Neuro/Psych  Headaches CVA (1994), No Residual Symptoms    GI/Hepatic ,GERD  ,,  Endo/Other  Prediabetes   Renal/GU negative Renal ROS     Musculoskeletal  (+) Arthritis ,    Abdominal   Peds  Hematology negative hematology ROS (+)   Anesthesia Other Findings   Reproductive/Obstetrics                             Anesthesia Physical Anesthesia Plan  ASA: 3  Anesthesia Plan: General   Post-op Pain Management:    Induction: Intravenous  PONV Risk Score and Plan: 3 and Propofol  infusion, TIVA and Treatment may vary due to age or medical condition  Airway Management Planned: Natural Airway  Additional Equipment:   Intra-op Plan:   Post-operative Plan:   Informed Consent: I have reviewed the patients History and Physical, chart, labs and discussed the procedure including the risks, benefits and alternatives for the proposed anesthesia with the patient or authorized representative who has indicated his/her understanding and acceptance.       Plan Discussed with: CRNA  Anesthesia Plan Comments: (LMA/GETA backup discussed.  Patient consented for risks of anesthesia including but not limited to:  - adverse reactions to medications - damage to eyes, teeth,  lips or other oral mucosa - nerve damage due to positioning  - sore throat or hoarseness - damage to heart, brain, nerves, lungs, other parts of body or loss of life  Informed patient about role of CRNA in peri- and intra-operative care.  Patient voiced understanding.)        Anesthesia Quick Evaluation

## 2023-09-03 NOTE — H&P (Signed)
 Pre-Procedure H&P   Patient ID: Natalie Cooper is a 78 y.o. female.  Gastroenterology Provider: [name], DO  Referring Provider: Laquetta Plank, PA PCP: Jimmy Moulding, MD  Date: 09/03/2023  HPI [name] is a 78 y.o. female who presents today for Colonoscopy for Personal history of colon polyps .  Patient underwent colonoscopy in 2019 with sigmoid diverticulosis.  A polyp was removed but not retrieved.  Internal hemorrhoids were also noted  Status post hysterectomy  Reports daily bowel without melena or hematochezia.  No family history of colon cancer or colon polyps   Past Medical History:  Diagnosis Date   Arthritis    Cancer (HCC)    BASAL CELL    GERD (gastroesophageal reflux disease)    Headache    MIGRAINES   Hypercholesterolemia    Hypertension    Migraines    Nystagmus    Osteoporosis    Pre-diabetes    Stroke (HCC) 1994   HEMORRHAGIC STROKE-HAD GAMMA KNIFE BRAIN PROCEDURE-PT DOES HAVE DOUBLE VISION THAT HAPPENS EVERY 15 MINUTES AND RESOLVES WITHIN 3 MINUTES SINCE HER STROKE IN 89   Vitamin D deficiency     Past Surgical History:  Procedure Laterality Date   ABDOMINAL HYSTERECTOMY     BREAST ENHANCEMENT SURGERY     BREAST EXCISIONAL BIOPSY Right    1980s benign   BREAST IMPLANT REMOVAL  05/01/2012   CATARACT EXTRACTION Bilateral    COLONOSCOPY WITH PROPOFOL  N/A 07/30/2017   Procedure: COLONOSCOPY WITH PROPOFOL ;  Surgeon: Cassie Click, MD;  Location: [facility] ENDOSCOPY;  Service: Endoscopy;  Laterality: N/A;   DUPUYTREN CONTRACTURE RELEASE Left 04/16/2015   Procedure: DUPUYTREN CONTRACTURE RELEASE index and ring finger;  Surgeon: Daris Edman, MD;  Location: [facility] ORS;  Service: Orthopedics;  Laterality: Left;   ENDOSCOPIC RELEASE TRANSVERSE CARPAL LIGAMENT OF HAND Left    EYE SURGERY     GAMMA KNIFE BRAIN PROCEDURE  05/01/1992   HIP SURGERY Right    TONSILLECTOMY     TUBAL LIGATION      Family History No h/o GI  disease or malignancy  Review of Systems  Constitutional:  Negative for activity change, appetite change, chills, diaphoresis, fatigue, fever and unexpected weight change.  HENT:  Negative for trouble swallowing and voice change.   Respiratory:  Negative for shortness of breath and wheezing.   Cardiovascular:  Negative for chest pain, palpitations and leg swelling.  Gastrointestinal:  Negative for abdominal distention, abdominal pain, anal bleeding, blood in stool, constipation, diarrhea, nausea, rectal pain and vomiting.  Musculoskeletal:  Negative for arthralgias and myalgias.  Skin:  Negative for color change and pallor.  Neurological:  Negative for dizziness, syncope and weakness.  Psychiatric/Behavioral:  Negative for confusion.   All other systems reviewed and are negative.    Medications No current facility-administered medications on file prior to encounter.   Current Outpatient Medications on File Prior to Encounter  Medication Sig Dispense Refill   atorvastatin (LIPITOR) 10 MG tablet Take 10 mg by mouth daily.     Calcium-Magnesium-Vitamin D (CALCIUM 500 PO) Take 2 tablets by mouth daily at 12 noon.     ibuprofen (ADVIL,MOTRIN) 200 MG tablet Take 200 mg by mouth every 6 (six) hours as needed.     lisinopril-hydrochlorothiazide (ZESTORETIC) 10-12.5 MG tablet Take 1 tablet by mouth daily.     Multiple Vitamin (MULTIVITAMIN) capsule Take 1 capsule by mouth daily.     acetaminophen  (TYLENOL ) 500 MG tablet Take 2 tablets (  1,000 mg total) by mouth every 8 (eight) hours. 90 tablet 2   HYDROcodone -acetaminophen  (NORCO) 10-325 MG tablet Take 1-2 tablets by mouth every 4 (four) hours as needed. 10 tablet 0   Lifitegrast (XIIDRA) 5 % SOLN Place 1 drop into both eyes daily.     ondansetron  (ZOFRAN -ODT) 4 MG disintegrating tablet Take 1 tablet (4 mg total) by mouth every 8 (eight) hours as needed for nausea or vomiting. 20 tablet 0    Pertinent medications related to GI and procedure were  reviewed by me with the patient prior to the procedure   Current Facility-Administered Medications:    0.9 %  sodium chloride  infusion, , Intravenous, Continuous, [name], DO, Last Rate: 20 mL/hr at 09/03/23 1101, New Bag at 09/03/23 1101  sodium chloride  20 mL/hr at 09/03/23 1101       Allergies  Allergen Reactions   Nsaids     PT HAD A HEMORRHAGIC STROKE IN 1994 AND WAS TOLD NOT TO TAKE ANY NSAIDS (PT DOES TAKE A 81 MG ASA AND IBUPROFEN)   Allergies were reviewed by me prior to the procedure  Objective   Body mass index is 24.3 kg/m. Vitals:   09/03/23 1052  BP: (!) 154/92  Pulse: 62  Resp: 15  Temp: (!) 96.5 F (35.8 C)  TempSrc: Temporal  SpO2: 100%  Weight: 62.2 kg     Physical Exam Vitals and nursing note reviewed.  Constitutional:      General: She is not in acute distress.    Appearance: Normal appearance. She is not ill-appearing, toxic-appearing or diaphoretic.  HENT:     Head: Normocephalic and atraumatic.     Nose: Nose normal.     Mouth/Throat:     Mouth: Mucous membranes are moist.     Pharynx: Oropharynx is clear.  Eyes:     General: No scleral icterus.    Extraocular Movements: Extraocular movements intact.  Cardiovascular:     Rate and Rhythm: Normal rate and regular rhythm.     Heart sounds: Normal heart sounds. No murmur heard.    No friction rub. No gallop.  Pulmonary:     Effort: Pulmonary effort is normal. No respiratory distress.     Breath sounds: Normal breath sounds. No wheezing, rhonchi or rales.  Abdominal:     General: Bowel sounds are normal. There is no distension.     Palpations: Abdomen is soft.     Tenderness: There is no abdominal tenderness. There is no guarding or rebound.  Musculoskeletal:     Cervical back: Neck supple.     Right lower leg: No edema.     Left lower leg: No edema.  Skin:    General: Skin is warm and dry.     Coloration: Skin is not jaundiced or pale.  Neurological:     General: No  focal deficit present.     Mental Status: She is alert and oriented to person, place, and time. Mental status is at baseline.  Psychiatric:        Mood and Affect: Mood normal.        Behavior: Behavior normal.        Thought Content: Thought content normal.        Judgment: Judgment normal.      Assessment:  [name] is a 78 y.o. female  who presents today for Colonoscopy for Personal history of colon polyps .  Plan:  Colonoscopy with possible intervention today  Colonoscopy with  possible biopsy, control of bleeding, polypectomy, and interventions as necessary has been discussed with the patient/patient representative. Informed consent was obtained from the patient/patient representative after explaining the indication, nature, and risks of the procedure including but not limited to death, bleeding, perforation, missed neoplasm/lesions, cardiorespiratory compromise, and reaction to medications. Opportunity for questions was given and appropriate answers were provided. Patient/patient representative has verbalized understanding is amenable to undergoing the procedure.   [name], DO  [facility]  Portions of the record may have been created with voice recognition software. Occasional wrong-word or 'sound-a-like' substitutions may have occurred due to the inherent limitations of voice recognition software.  Read the chart carefully and recognize, using context, where substitutions may have occurred.

## 2023-09-03 NOTE — Anesthesia Postprocedure Evaluation (Signed)
 Anesthesia Post Note  Patient: Natalie Cooper  Procedure(s) Performed: COLONOSCOPY WITH PROPOFOL   Patient location during evaluation: PACU Anesthesia Type: General Level of consciousness: awake and alert, oriented and patient cooperative Pain management: pain level controlled Vital Signs Assessment: post-procedure vital signs reviewed and stable Respiratory status: spontaneous breathing, nonlabored ventilation and respiratory function stable Cardiovascular status: blood pressure returned to baseline and stable Postop Assessment: adequate PO intake Anesthetic complications: no   No notable events documented.   Last Vitals:  Vitals:   09/03/23 1200 09/03/23 1210  BP: 107/66 116/66  Pulse: 72   Resp: 17 16  Temp:    SpO2: 98%     Last Pain:  Vitals:   09/03/23 1210  TempSrc:   PainSc: 0-No pain                 Dorothey Gate

## 2023-09-03 NOTE — Op Note (Signed)
 [facility] Gastroenterology Patient Name: Natalie Cooper Procedure Date: 09/03/2023 11:23 AM MRN: [id number] Account #: [id number] Date of Birth: 12-11-1945 Admit Type: Outpatient Age: 78 Room: [facility] ENDO ROOM 2 Gender: Female Note Status: Finalized Instrument Name: Colonoscope [id number] Procedure:             Colonoscopy Indications:           High risk colon cancer surveillance: Personal history                         of colonic polyps Providers:             Bridgett Camps, DO Referring MD:          [name] DO, DO (Referring MD), Jimmy Moulding MD, MD (Referring MD) Medicines:             Monitored Anesthesia Care Complications:         No immediate complications. Estimated blood loss: None. Procedure:             Pre-Anesthesia Assessment:                        - Prior to the procedure, a History and Physical was                         performed, and patient medications and allergies were                         reviewed. The patient is competent. The risks and                         benefits of the procedure and the sedation options and                         risks were discussed with the patient. All questions                         were answered and informed consent was obtained.                         Patient identification and proposed procedure were                         verified by the physician, the nurse, the anesthetist                         and the technician in the endoscopy suite. Mental                         Status Examination: alert and oriented. Airway                         Examination: normal oropharyngeal airway and neck                         mobility. Respiratory Examination: clear to  auscultation. CV Examination: RRR, no murmurs, no S3                         or S4. Prophylactic Antibiotics: The patient does not                         require prophylactic  antibiotics. Prior                         Anticoagulants: The patient has taken no anticoagulant                         or antiplatelet agents. ASA Grade Assessment: III - A                         patient with severe systemic disease. After reviewing                         the risks and benefits, the patient was deemed in                         satisfactory condition to undergo the procedure. The                         anesthesia plan was to use monitored anesthesia care                         (MAC). Immediately prior to administration of                         medications, the patient was re-assessed for adequacy                         to receive sedatives. The heart rate, respiratory                         rate, oxygen saturations, blood pressure, adequacy of                         pulmonary ventilation, and response to care were                         monitored throughout the procedure. The physical                         status of the patient was re-assessed after the                         procedure.                        After obtaining informed consent, the colonoscope was                         passed under direct vision. Throughout the procedure,                         the patient's blood pressure, pulse, and oxygen  saturations were monitored continuously. The                         Colonoscope was introduced through the anus and                         advanced to the the terminal ileum, with                         identification of the appendiceal orifice and IC                         valve. The colonoscopy was performed without                         difficulty. The patient tolerated the procedure well.                         The quality of the bowel preparation was evaluated                         using the BBPS [facility] Bowel Preparation Scale) with                         scores of: Right Colon = 3 (entire mucosa seen well                          with no residual staining, small fragments of stool or                         opaque liquid), Transverse Colon = 3 (entire mucosa                         seen well with no residual staining, small fragments                         of stool or opaque liquid) and Left Colon = 2 (minor                         amount of residual staining, small fragments of stool                         and/or opaque liquid, but mucosa seen well). The total                         BBPS score equals 8. The quality of the bowel                         preparation was excellent. The terminal ileum,                         ileocecal valve, appendiceal orifice, and rectum were                         photographed. Findings:      The perianal and digital rectal examinations were normal. Pertinent       negatives include normal sphincter tone.  The terminal ileum appeared normal. Estimated blood loss: none.      Multiple small-mouthed diverticula were found in the sigmoid colon.       Estimated blood loss: none.      Non-bleeding internal hemorrhoids were found during retroflexion. The       hemorrhoids were Grade I (internal hemorrhoids that do not prolapse).       Estimated blood loss: none.      The exam was otherwise without abnormality on direct and retroflexion       views. Impression:            - The examined portion of the ileum was normal.                        - Diverticulosis in the sigmoid colon.                        - Non-bleeding internal hemorrhoids.                        - The examination was otherwise normal on direct and                         retroflexion views.                        - No specimens collected. Recommendation:        - Patient has a contact number available for                         emergencies. The signs and symptoms of potential                         delayed complications were discussed with the patient.                         Return to normal  activities tomorrow. Written                         discharge instructions were provided to the patient.                        - Discharge patient to home.                        - Resume previous diet.                        - Continue present medications.                        - - Repeat colonoscopy in 7 years if medically                         appropriate candidate for surveillance.                        - Return to referring physician as previously                         scheduled.                        -  The findings and recommendations were discussed with                         the patient. Procedure Code(s):     --- Professional ---                        [phone], Colonoscopy, flexible; diagnostic, including                         collection of specimen(s) by brushing or washing, when                         performed (separate procedure) Diagnosis Code(s):     --- Professional ---                        Z86.010, Personal history of colonic polyps                        K64.0, First degree hemorrhoids                        K57.30, Diverticulosis of large intestine without                         perforation or abscess without bleeding CPT copyright 2022 American [employer] Association. All rights reserved. The codes documented in this report are preliminary and upon coder review may  be revised to meet current compliance requirements. Attending Participation:      I personally performed the entire procedure. Polo Brisk, DO [name] DO, DO 09/03/2023 11:52:10 AM This report has been signed electronically. Number of Addenda: 0 Note Initiated On: 09/03/2023 11:23 AM Scope Withdrawal Time: 0 hours 10 minutes 28 seconds  Total Procedure Duration: 0 hours 14 minutes 56 seconds  Estimated Blood Loss:  Estimated blood loss: none.      [facility]

## 2023-09-03 NOTE — Interval H&P Note (Signed)
 History and Physical Interval Note: Preprocedure H&P from 09/03/23  was reviewed and there was no interval change after seeing and examining the patient.  Written consent was obtained from the patient after discussion of risks, benefits, and alternatives. Patient has consented to proceed with Colonoscopy with possible intervention   09/03/2023 11:26 AM  Natalie Cooper  has presented today for surgery, with the diagnosis of Z86.0101 (ICD-10-CM) - Personal history of adenomatous and serrated colon polyps.  The various methods of treatment have been discussed with the patient and family. After consideration of risks, benefits and other options for treatment, the patient has consented to  Procedure(s): COLONOSCOPY WITH PROPOFOL  (N/A) as a surgical intervention.  The patient's history has been reviewed, patient examined, no change in status, stable for surgery.  I have reviewed the patient's chart and labs.  Questions were answered to the patient's satisfaction.     [name]

## 2023-09-20 DIAGNOSIS — I10 Essential (primary) hypertension: Secondary | ICD-10-CM | POA: Diagnosis not present

## 2023-09-20 DIAGNOSIS — R7303 Prediabetes: Secondary | ICD-10-CM | POA: Diagnosis not present

## 2023-09-20 DIAGNOSIS — E7800 Pure hypercholesterolemia, unspecified: Secondary | ICD-10-CM | POA: Diagnosis not present

## 2023-09-20 DIAGNOSIS — Z0000 Encounter for general adult medical examination without abnormal findings: Secondary | ICD-10-CM | POA: Diagnosis not present

## 2023-09-20 DIAGNOSIS — Z1331 Encounter for screening for depression: Secondary | ICD-10-CM | POA: Diagnosis not present

## 2024-01-03 DIAGNOSIS — M25561 Pain in right knee: Secondary | ICD-10-CM | POA: Diagnosis not present

## 2024-01-14 ENCOUNTER — Other Ambulatory Visit: Payer: Self-pay | Admitting: Internal Medicine

## 2024-01-14 DIAGNOSIS — Z1231 Encounter for screening mammogram for malignant neoplasm of breast: Secondary | ICD-10-CM

## 2024-02-12 ENCOUNTER — Ambulatory Visit
Admission: RE | Admit: 2024-02-12 | Discharge: 2024-02-12 | Disposition: A | Discharge status: Home / Self Care | Source: Ambulatory Visit | Attending: Internal Medicine | Admitting: Internal Medicine

## 2024-02-12 DIAGNOSIS — Z1231 Encounter for screening mammogram for malignant neoplasm of breast: Secondary | ICD-10-CM | POA: Diagnosis not present

## 2024-03-21 NOTE — Progress Notes (Signed)
 Natalie Cooper                                          MRN: [id number]   03/21/2024   The [facility] Quality Team Specialist reviewed this patient medical record for the purposes of chart review for care gap closure. The following were reviewed: chart review for care gap closure-controlling blood pressure.    [facility] Quality Team

## 2024-03-26 DIAGNOSIS — H04123 Dry eye syndrome of bilateral lacrimal glands: Secondary | ICD-10-CM | POA: Diagnosis not present

## 2024-03-26 DIAGNOSIS — Z961 Presence of intraocular lens: Secondary | ICD-10-CM | POA: Diagnosis not present
